# Patient Record
Sex: Female | Born: 1975 | Race: Black or African American | Hispanic: No | Smoking: Never smoker
Health system: Southern US, Community
[De-identification: ages and names within clinical notes are randomized; demographics above are authoritative.]

## PROBLEM LIST (undated history)

## (undated) DIAGNOSIS — F419 Anxiety disorder, unspecified: Secondary | ICD-10-CM

## (undated) DIAGNOSIS — D649 Anemia, unspecified: Secondary | ICD-10-CM

## (undated) DIAGNOSIS — F329 Major depressive disorder, single episode, unspecified: Secondary | ICD-10-CM

## (undated) DIAGNOSIS — J45909 Unspecified asthma, uncomplicated: Secondary | ICD-10-CM

## (undated) DIAGNOSIS — A630 Anogenital (venereal) warts: Secondary | ICD-10-CM

## (undated) HISTORY — PX: KNEE SURGERY: SHX244

## (undated) HISTORY — DX: Anxiety disorder, unspecified: F41.9

## (undated) HISTORY — DX: Major depressive disorder, single episode, unspecified: F32.9

---

## 1997-09-03 ENCOUNTER — Encounter: Admission: RE | Admit: 1997-09-03 | Discharge: 1997-12-02 | Payer: Self-pay | Admitting: Orthopedic Surgery

## 2012-06-05 DIAGNOSIS — F32A Depression, unspecified: Secondary | ICD-10-CM

## 2012-06-05 DIAGNOSIS — F419 Anxiety disorder, unspecified: Secondary | ICD-10-CM

## 2012-06-05 HISTORY — DX: Depression, unspecified: F32.A

## 2012-06-05 HISTORY — DX: Anxiety disorder, unspecified: F41.9

## 2014-01-30 ENCOUNTER — Encounter: Payer: Self-pay | Admitting: Internal Medicine

## 2014-01-30 ENCOUNTER — Ambulatory Visit: Payer: Self-pay | Attending: Internal Medicine | Admitting: Internal Medicine

## 2014-01-30 VITALS — BP 114/85 | HR 81 | Temp 99.4°F | Resp 16 | Ht 67.0 in | Wt 182.0 lb

## 2014-01-30 DIAGNOSIS — E049 Nontoxic goiter, unspecified: Secondary | ICD-10-CM | POA: Insufficient documentation

## 2014-01-30 DIAGNOSIS — J45909 Unspecified asthma, uncomplicated: Secondary | ICD-10-CM | POA: Insufficient documentation

## 2014-01-30 DIAGNOSIS — G8929 Other chronic pain: Secondary | ICD-10-CM | POA: Insufficient documentation

## 2014-01-30 DIAGNOSIS — M25569 Pain in unspecified knee: Secondary | ICD-10-CM | POA: Insufficient documentation

## 2014-01-30 DIAGNOSIS — R609 Edema, unspecified: Secondary | ICD-10-CM | POA: Insufficient documentation

## 2014-01-30 DIAGNOSIS — F411 Generalized anxiety disorder: Secondary | ICD-10-CM | POA: Insufficient documentation

## 2014-01-30 DIAGNOSIS — F329 Major depressive disorder, single episode, unspecified: Secondary | ICD-10-CM | POA: Insufficient documentation

## 2014-01-30 DIAGNOSIS — M25562 Pain in left knee: Secondary | ICD-10-CM

## 2014-01-30 DIAGNOSIS — F3289 Other specified depressive episodes: Secondary | ICD-10-CM | POA: Insufficient documentation

## 2014-01-30 LAB — COMPLETE METABOLIC PANEL WITH GFR
ALT: 13 U/L (ref 0–35)
AST: 15 U/L (ref 0–37)
Albumin: 4.5 g/dL (ref 3.5–5.2)
Alkaline Phosphatase: 54 U/L (ref 39–117)
BILIRUBIN TOTAL: 0.5 mg/dL (ref 0.2–1.2)
BUN: 8 mg/dL (ref 6–23)
CALCIUM: 9.6 mg/dL (ref 8.4–10.5)
CHLORIDE: 104 meq/L (ref 96–112)
CO2: 25 meq/L (ref 19–32)
Creat: 0.7 mg/dL (ref 0.50–1.10)
Glucose, Bld: 93 mg/dL (ref 70–99)
Potassium: 4.2 mEq/L (ref 3.5–5.3)
SODIUM: 138 meq/L (ref 135–145)
TOTAL PROTEIN: 7.8 g/dL (ref 6.0–8.3)

## 2014-01-30 LAB — CBC
HCT: 39.6 % (ref 36.0–46.0)
HEMOGLOBIN: 13.2 g/dL (ref 12.0–15.0)
MCH: 30.4 pg (ref 26.0–34.0)
MCHC: 33.3 g/dL (ref 30.0–36.0)
MCV: 91.2 fL (ref 78.0–100.0)
Platelets: 288 10*3/uL (ref 150–400)
RBC: 4.34 MIL/uL (ref 3.87–5.11)
RDW: 13.3 % (ref 11.5–15.5)
WBC: 6.2 10*3/uL (ref 4.0–10.5)

## 2014-01-30 LAB — LIPID PANEL
Cholesterol: 173 mg/dL (ref 0–200)
HDL: 61 mg/dL (ref >39)
LDL Cholesterol: 104 mg/dL — ABNORMAL HIGH (ref 0–99)
Total CHOL/HDL Ratio: 2.8 Ratio
Triglycerides: 42 mg/dL (ref <150)
VLDL: 8 mg/dL (ref 0–40)

## 2014-01-30 LAB — RHEUMATOID FACTOR: Rhuematoid fact SerPl-aCnc: <10 IU/mL (ref <=14)

## 2014-01-30 LAB — TSH: TSH: 1.054 u[IU]/mL (ref 0.350–4.500)

## 2014-01-30 LAB — T4, FREE: Free T4: 1.11 ng/dL (ref 0.80–1.80)

## 2014-01-30 MED ORDER — ACETAMINOPHEN-CODEINE #3 300-30 MG PO TABS: 1.0000 | ORAL_TABLET | Freq: Three times a day (TID) | ORAL | Status: DC | PRN

## 2014-01-30 NOTE — Progress Notes (Signed)
Pt is here to establish care. Pt reports having chronic pain in her left knee. Pt states that concerned about the pain in her joints spreading because right now she is having pain in her 1 finger on her right hand and middle toe on her right foot.

## 2014-01-30 NOTE — Progress Notes (Signed)
Patient ID: Joanne Baker, female   DOB: [birth year], 38 y.o.   MRN: [id number]  [id number]:[id number]  [id number]  DOB - [birth year]  CC:  Chief Complaint  Patient presents with  . Establish Care       HPI: Joanne Baker is a 38 y.o. female here today to establish medical care.  Patient reports that she has a history of depression, anxiety, and arthritis.  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 Originally had a torn meniscus, then had injury after meniscal repair surgery. She was last told that she may need a knee replacement but she is too young now. In the past she reports that she has been on perocets in the past which did not help.  She states that she has a knee brace and has had difficulty going up stairs and severe pain with walking.  Her index finger on her right hand and her second toe on her right foot has been painful at the joint spaces.  She denies swelling, redness, and warmth of joints.  She appears non toxic.   No Known Allergies Past Medical History  Diagnosis Date  . Anxiety 2014  . Depression 2014   No current outpatient prescriptions on file prior to visit.   No current facility-administered medications on file prior to visit.   Family History  Problem Relation Age of Onset  . Hypothyroidism Mother   . Arthritis Mother   . Cancer Father   . Diabetes Father   . Heart disease Father   . Cancer Sister   . Hypothyroidism Sister    History   Social History  . Marital Status: Single    Spouse Name: N/A    Number of Children: N/A  . Years of Education: N/A   Occupational History  . Not on file.   Social History Main Topics  . Smoking status: Never Smoker   . Smokeless tobacco: Not on file  . Alcohol Use: 2.5 oz/week    5 drink(s) per week  . Drug Use: No  . Sexual Activity: Yes   Other Topics Concern  . Not on file   Social History Narrative  . No  narrative on file   Review of Systems  Constitutional: Negative.   HENT: Negative.   Respiratory: Positive for cough and shortness of breath.        History of asthma   Cardiovascular: Negative.   Gastrointestinal: Negative.   Genitourinary: Negative.   Musculoskeletal: Negative.   Skin: Negative.   Neurological: Negative.   Psychiatric/Behavioral: Negative.       Objective:   Filed Vitals:   01/30/14 1058  BP: 114/85  Pulse: 81  Temp: 99.4 F (37.4 C)  Resp: 16   Physical Exam  Constitutional: She is oriented to person, place, and time.  HENT:  Right Ear: External ear normal.  Left Ear: External ear normal.  Mouth/Throat: Oropharynx is clear and moist.  Eyes: Conjunctivae and EOM are normal. Pupils are equal, round, and reactive to light.  Neck: Normal range of motion.  Cardiovascular: Normal rate, regular rhythm, normal heart sounds and intact distal pulses.   Pulmonary/Chest: Effort normal and breath sounds normal.  Abdominal: Soft. Bowel sounds are normal.  Musculoskeletal: She exhibits edema (base of index finger) and tenderness.  Neurological: She is alert and oriented to person, place, and time. She has normal reflexes. No cranial nerve deficit.  Skin: Skin  is warm and dry.  Psychiatric: She has a normal mood and affect.     No results found for this basename: WBC, HGB, HCT, MCV, PLT   No results found for this basename: CREATININE, BUN, NA, K, CL, CO2    No results found for this basename: HGBA1C   Lipid Panel  No results found for this basename: chol, trig, hdl, cholhdl, vldl, ldlcalc       Assessment and plan:   Joanne Baker was seen today for establish care.  Diagnoses and associated orders for this visit:  Enlarged thyroid - TSH - T4, Free - CBC - COMPLETE METABOLIC PANEL WITH GFR - Lipid panel  Chronic knee pain, left - Rheumatoid factor - ANA - Ambulatory referral to Sports Medicine - acetaminophen-codeine (TYLENOL #3) 300-30 MG per  tablet; Take 1 tablet by mouth every 8 (eight) hours as needed for moderate pain.   Return if symptoms worsen or fail to improve.   The patient was given clear instructions to go to ER or return to medical center if symptoms don't improve, worsen or new problems develop. The patient verbalized understanding.   Holland Commons, NP-C [facility] [phone] 01/30/2014, 11:21 AM

## 2014-02-02 LAB — ANA: Anti Nuclear Antibody(ANA): NEGATIVE

## 2014-02-03 ENCOUNTER — Telehealth: Payer: Self-pay | Admitting: Emergency Medicine

## 2014-02-03 ENCOUNTER — Telehealth: Payer: Self-pay | Admitting: *Deleted

## 2014-02-03 NOTE — Telephone Encounter (Signed)
Left message for pt to call for lab results 

## 2014-02-03 NOTE — Telephone Encounter (Signed)
Pt is aware of her lab results.  

## 2014-02-03 NOTE — Telephone Encounter (Signed)
Message copied by Darlis Loan on Tue Feb 03, 2014 11:20 AM ------      Message from: Holland Commons A      Created: Mon Feb 02, 2014 12:40 PM       Labs are normal ------

## 2014-02-03 NOTE — Telephone Encounter (Signed)
Message copied by Raynelle Chary on Tue Feb 03, 2014 12:05 PM ------      Message from: Holland Commons A      Created: Mon Feb 02, 2014 12:40 PM       Labs are normal ------

## 2014-02-11 ENCOUNTER — Ambulatory Visit (INDEPENDENT_AMBULATORY_CARE_PROVIDER_SITE_OTHER): Payer: Self-pay | Admitting: Sports Medicine

## 2014-02-11 ENCOUNTER — Encounter: Payer: Self-pay | Admitting: Sports Medicine

## 2014-02-11 VITALS — BP 111/75 | Ht 66.5 in | Wt 175.0 lb

## 2014-02-11 DIAGNOSIS — M79644 Pain in right finger(s): Secondary | ICD-10-CM

## 2014-02-11 DIAGNOSIS — M357 Hypermobility syndrome: Secondary | ICD-10-CM

## 2014-02-11 DIAGNOSIS — M25569 Pain in unspecified knee: Secondary | ICD-10-CM

## 2014-02-11 DIAGNOSIS — M25562 Pain in left knee: Secondary | ICD-10-CM | POA: Insufficient documentation

## 2014-02-11 DIAGNOSIS — M79609 Pain in unspecified limb: Secondary | ICD-10-CM

## 2014-02-11 MED ORDER — METHYLPREDNISOLONE ACETATE 40 MG/ML IJ SUSP
40.0000 mg | Freq: Once | INTRAMUSCULAR | Status: AC
  Administered 2014-02-11: 40 mg via INTRA_ARTICULAR

## 2014-02-11 NOTE — Progress Notes (Signed)
Joanne Baker - 38 y.o. female MRN [id number]  Date of birth: [birth year]  SUBJECTIVE:  Including CC & ROS.  The patient is referred for evaluation of: Left Knee Pain: Prolonged symptoms from significant surgical interventions in the past. She has had 5 months of worsening intermittent mechanical locking with ambulation. She denies any falls, trauma or reinjury to this knee but notices she will be fine then all of a sudden have sharp shooting burning pain deep within the knee and inability to bend or flex. She will be started in this position for a couple of minutes until she is able to reposition and is able to move the knee Right Index Finger Pain: 5-6 months of right index finger pain that has been worse with activity. She reports mainly pain with any type of grip strength especially while shopping. She is left-hand dominant. No prior injury. No discoloration, no swelling.   HISTORY: Past Medical, Surgical, Social, and Family History Reviewed & Updated per EMR. Pertinent Historical Findings include: Non-smoker, some EtOH.  Hx of Anxiety and depression Reported 3 prior knee surgeries including a microfracture and meniscectomy; most recent surgeries 9 years ago.; prior C-section  DATA REVIEWED: Labs from 01/30/14: normal CMET, CBC. TSH 1.054. Normal ANA and RF  OBJECTIVE FINDINGS:  VS:  HT:5' 6.5" (168.9 cm)   WT:175 lb (79.379 kg)  BMI:27.9          BP:111/75 mmHg  HR: bpm  TEMP: ( )  RESP:   PHYSICAL EXAM:            GENERAL:  Adult African American female. In no discomfort; no respiratory distress                PSYCH:  alert and appropriate, good insight  Left Knee Exam:   APPEAR/PALP:  No significant effusion. Overall well aligned. Poor VMO definition. Positive patellar grind. No medial or lateral jointline tenderness.                    ROM:  Full flexion and extension        STRENGTH:  Extensor mechanism 4/5, flexion 5 out of 5.                   NV:  Sensation grossly  intact.              Tests:  Stable anterior posterior drawer. Stable to varus and valgus stress. Negative Thessaly Right Hand Exam:   APPEAR/PALP:  Normal-appearing, long fingers. Hypermobile bilateral MCP joints 2 dorsal strain. Right radial collateral ligament at the MCP of the index finger without solid endpoint. Good on the left.                    ROM:  Full finger and wrist range of motion.        STRENGTH:  Finger strength 5+ out of 5                   NV:  Sensation grossly intact, good capillary refill [place] Hypermobility SCORE:  7 / 9 5th Finger >90o L - Yes.   R - Yes.    Thumb to forearm L - No. R - No.  Elbow hyperextension > -10 L - Yes.   R - Yes.    Knee hyperextension > -10 L - Yes.   R - Yes.    Hand to ground Yes.         ASSESSMENT:  1. Left knee pain   2. Finger pain, right   3. 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 - Left knee pain is likely secondary to significant osteoarthritis and likely loose body within the knee given the prior microfracture and chondral damage. Suspect OCD. Patient has x-rays from April of this year and we'll obtain copies and bring them to her next visit. She is not a surgical candidate at this time due to lack of insurance. - Right radial collateral ligament of the index finger likely strain/sprain. Hypermobile fingers in general.   PLAN: See problem based charting & AVS for additional documentation. - left knee injection today.  Patient will bring in x-rays obtained from [facility] to her next visit to review. Discussed that she needs to bring in the actual images and not just report.  - QUAD sets for VMO strengthening. - hand exercises for strengthening/stablizing joint.  Buddy taping with activity of second and third fingers. > Return in 6 weeks (on 03/25/2014) for re-evaluation of finger and knee.    PROCEDURE NOTE : Left knee  Injection The risks, benefits and expected outcomes of the injection were reviewed and she wishes to undergo the above named procedure.  Written consent was obtained. After an appropriate time out was taken the left main was prepped in a sterile fashion and injected as below: Approach:  Bent knee inferior lateral Needle:  25-gauge 1.5  inch Meds:   1 cc of 40 mg Depo-Medrol, 3 cc of 1% lidocaine without epi A bandaid was applied to the area. This procedure was well tolerated and there were no complications.

## 2014-02-11 NOTE — Patient Instructions (Signed)
Remember to do the exercises we discussed for your knee. Work up to doing 15 reps three times a day.  Use a squeeze ball for your finger to strengthen the surrounding muscles.

## 2014-03-02 ENCOUNTER — Ambulatory Visit: Payer: Self-pay

## 2014-03-18 ENCOUNTER — Ambulatory Visit: Payer: Self-pay | Admitting: Sports Medicine

## 2014-05-07 ENCOUNTER — Encounter (HOSPITAL_COMMUNITY): Payer: Self-pay | Admitting: *Deleted

## 2014-05-07 ENCOUNTER — Emergency Department (HOSPITAL_COMMUNITY)
Admission: EM | Admit: 2014-05-07 | Discharge: 2014-05-07 | Disposition: A | Discharge status: Home / Self Care | Payer: Self-pay | Attending: Emergency Medicine | Admitting: Emergency Medicine

## 2014-05-07 DIAGNOSIS — Z7951 Long term (current) use of inhaled steroids: Secondary | ICD-10-CM | POA: Insufficient documentation

## 2014-05-07 DIAGNOSIS — F1010 Alcohol abuse, uncomplicated: Secondary | ICD-10-CM | POA: Insufficient documentation

## 2014-05-07 DIAGNOSIS — Z3202 Encounter for pregnancy test, result negative: Secondary | ICD-10-CM | POA: Insufficient documentation

## 2014-05-07 DIAGNOSIS — F32A Depression, unspecified: Secondary | ICD-10-CM

## 2014-05-07 DIAGNOSIS — F419 Anxiety disorder, unspecified: Secondary | ICD-10-CM | POA: Insufficient documentation

## 2014-05-07 DIAGNOSIS — F329 Major depressive disorder, single episode, unspecified: Secondary | ICD-10-CM | POA: Insufficient documentation

## 2014-05-07 LAB — URINALYSIS, ROUTINE W REFLEX MICROSCOPIC
Glucose, UA: NEGATIVE mg/dL
Hgb urine dipstick: NEGATIVE
KETONES UR: 15 mg/dL — AB
Nitrite: NEGATIVE
PH: 6 (ref 5.0–8.0)
PROTEIN: 30 mg/dL — AB
Specific Gravity, Urine: 1.03 (ref 1.005–1.030)
Urobilinogen, UA: 1 mg/dL (ref 0.0–1.0)

## 2014-05-07 LAB — COMPREHENSIVE METABOLIC PANEL
ALBUMIN: 3.7 g/dL (ref 3.5–5.2)
ALK PHOS: 52 U/L (ref 39–117)
ALT: 16 U/L (ref 0–35)
ANION GAP: 14 (ref 5–15)
AST: 17 U/L (ref 0–37)
BILIRUBIN TOTAL: 0.5 mg/dL (ref 0.3–1.2)
BUN: 4 mg/dL — ABNORMAL LOW (ref 6–23)
CHLORIDE: 100 meq/L (ref 96–112)
CO2: 23 mEq/L (ref 19–32)
Calcium: 9.3 mg/dL (ref 8.4–10.5)
Creatinine, Ser: 0.68 mg/dL (ref 0.50–1.10)
GFR calc Af Amer: >90 mL/min (ref >90)
GFR calc non Af Amer: >90 mL/min (ref >90)
Glucose, Bld: 96 mg/dL (ref 70–99)
POTASSIUM: 3.5 meq/L — AB (ref 3.7–5.3)
Sodium: 137 mEq/L (ref 137–147)
TOTAL PROTEIN: 7.5 g/dL (ref 6.0–8.3)

## 2014-05-07 LAB — CBC
HCT: 37.2 % (ref 36.0–46.0)
Hemoglobin: 12.2 g/dL (ref 12.0–15.0)
MCH: 29.8 pg (ref 26.0–34.0)
MCHC: 32.8 g/dL (ref 30.0–36.0)
MCV: 90.7 fL (ref 78.0–100.0)
PLATELETS: 212 10*3/uL (ref 150–400)
RBC: 4.1 MIL/uL (ref 3.87–5.11)
RDW: 11.8 % (ref 11.5–15.5)
WBC: 7.7 10*3/uL (ref 4.0–10.5)

## 2014-05-07 LAB — URINE MICROSCOPIC-ADD ON

## 2014-05-07 LAB — RAPID URINE DRUG SCREEN, HOSP PERFORMED
AMPHETAMINES: NOT DETECTED
BENZODIAZEPINES: NOT DETECTED
Barbiturates: NOT DETECTED
COCAINE: NOT DETECTED
Opiates: NOT DETECTED
Tetrahydrocannabinol: NOT DETECTED

## 2014-05-07 LAB — PREGNANCY, URINE: PREG TEST UR: NEGATIVE

## 2014-05-07 LAB — ETHANOL: Alcohol, Ethyl (B): <11 mg/dL (ref 0–11)

## 2014-05-07 NOTE — ED Notes (Signed)
Writer [place] call from [place] who will fax over papers regarding pt treatment and she will speak with provider

## 2014-05-07 NOTE — ED Notes (Addendum)
Pt denies SI/HI upon leaving the ED. Pt given outpatient resources.

## 2014-05-07 NOTE — BHH Counselor (Signed)
Writer called and spoke w/ Will Dansie PA-C prior to TA. [facility] telassessment Cart #2 isn't working. Marcelino DusterMichelle RN is going to try using Peds Cart#1.  Evette Cristalaroline Paige Raman [place], [place] Assessment Counselor

## 2014-05-07 NOTE — ED Notes (Addendum)
Patient states anxiety and depression has been worsening x 30 days, last 3 days patient states anxiety out of control, drinking more than normal, medications are not working, patient states she has no thoughts of hurting self or others, she would just take an extra pill with alcohol to try to get some sleep

## 2014-05-07 NOTE — ED Notes (Signed)
Pt states "i haven't had a period since i don't know when" hx irregular periods

## 2014-05-07 NOTE — Discharge Instructions (Signed)
Alcohol and Nutrition Nutrition serves two purposes. It provides energy. It also maintains body structure and function. Food supplies energy. It also provides the building blocks needed to replace worn or damaged cells. Alcoholics often eat poorly. This limits their supply of essential nutrients. This affects energy supply and structure maintenance. Alcohol also affects the body's nutrients in: 1. Digestion. 2. Storage. 3. Using and getting rid of waste products. IMPAIRMENT OF NUTRIENT DIGESTION AND UTILIZATION   Once ingested, food must be broken down into small components (digested). Then it is available for energy. It helps maintain body structure and function. Digestion begins in the mouth. It continues in the stomach and intestines, with help from the pancreas. The nutrients from digested food are absorbed from the intestines into the blood. Then they are carried to the liver. The liver prepares nutrients for:  Immediate use.  Storage and future use.  Alcohol inhibits the breakdown of nutrients into usable molecules.  It decreases secretion of digestive enzymes from the pancreas.  Alcohol impairs nutrient absorption by damaging the cells lining the stomach and intestines.  It also interferes with moving some nutrients into the blood.  In addition, nutritional deficiencies themselves may lead to further absorption problems.  For example, folate deficiency changes the cells that line the small intestine. This impairs how water is absorbed. It also affects absorbed nutrients. These include glucose, sodium, and additional folate.  Even if nutrients are digested and absorbed, alcohol can prevent them from being fully used. It changes their transport, storage, and excretion. Impaired utilization of nutrients by alcoholics is indicated by:  Decreased liver stores of vitamins, such as vitamin A.  Increased excretion of nutrients such as fat. ALCOHOL AND ENERGY SUPPLY   Three basic  nutritional components found in food are:  Carbohydrates.  Proteins.  Fats.  These are used as energy. Some alcoholics take in as much as 50% of their total daily calories from alcohol. They often neglect important foods.  Even when enough food is eaten, alcohol can impair the ways the body controls blood sugar (glucose) levels. It may either increase or decrease blood sugar.  In non-diabetic alcoholics, increased blood sugar (hyperglycemia) is caused by poor insulin secretion. It is usually temporary.  Decreased blood sugar (hypoglycemia) can cause serious injury even if this condition is short-lived. Low blood sugar can happen when a fasting or malnourished person drinks alcohol. When there is no food to supply energy, stored sugar is used up. The products of alcohol inhibit forming glucose from other compounds such as amino acids. As a result, alcohol causes the brain and other body tissue to lack glucose. It is needed for energy and function.  Alcohol is an energy source. But how the body processes and uses the energy from alcohol is complex. Also, when alcohol is substituted for carbohydrates, subjects tend to lose weight. This indicates that they get less energy from alcohol than from food. ALCOHOL - MAINTAINING CELL STRUCTURE AND FUNCTION  Structure Cells are made mostly of protein. So an adequate protein diet is important for maintaining cell structure. This is especially true if cells are being damaged. Research indicates that alcohol affects protein nutrition by causing impaired:  Digestion of proteins to amino acids.  Processing of amino acids by the small intestine and liver.  Synthesis of proteins from amino acids.  Protein secretion by the liver. Function Nutrients are essential for the body to function well. They provide the tools that the body needs to work well:  Proteins. °· Vitamins. °· Minerals. °Alcohol can disrupt body function. It may cause nutrient  deficiencies. And it may interfere with the way nutrients are processed. °Vitamins °· Vitamins are essential to maintain growth and normal metabolism. They regulate many of the body`s processes. Chronic heavy drinking causes deficiencies in many vitamins. This is caused by eating less. And, in some cases, vitamins may be poorly absorbed. For example, alcohol inhibits fat absorption. It impairs how the vitamins A, E, and D are normally absorbed along with dietary fats. Not enough vitamin A may cause night blindness. Not enough vitamin D may cause softening of the bones. °· Some alcoholics lack vitamins A, C, D, E, K, and the B vitamins. These are all involved in wound healing and cell maintenance. In particular, because vitamin K is necessary for blood clotting, lacking that vitamin can cause delayed clotting. The result is excess bleeding. Lacking other vitamins involved in brain function may cause severe neurological damage. °Minerals °Deficiencies of minerals such as calcium, magnesium, iron, and zinc are common in alcoholics. The alcohol itself does not seem to affect how these minerals are absorbed. Rather, they seem to occur secondary to other alcohol-related problems, such as: °· Less calcium absorbed. °· Not enough magnesium. °· More urinary excretion. °· Vomiting. °· Diarrhea. °· Not enough iron due to gastrointestinal bleeding. °· Not enough zinc or losses related to other nutrient deficiencies. °· Mineral deficiencies can cause a variety of medical consequences. These range from calcium-related bone disease to zinc-related night blindness and skin lesions. °ALCOHOL, MALNUTRITION, AND MEDICAL COMPLICATIONS  °Liver Disease  °· Alcoholic liver damage is caused primarily by alcohol itself. But poor nutrition may increase the risk of alcohol-related liver damage. For example, nutrients normally found in the liver are known to be affected by drinking alcohol. These include carotenoids, which are the major  sources of vitamin A, and vitamin E compounds. Decreases in such nutrients may play some role in alcohol-related liver damage. °Pancreatitis °· Research suggests that malnutrition may increase the risk of developing alcoholic pancreatitis. Research suggests that a diet lacking in protein may increase alcohol's damaging effect on the pancreas. °Brain °· Nutritional deficiencies may have severe effects on brain function. These may be permanent. Specifically, thiamine deficiencies are often seen in alcoholics. They can cause severe neurological problems. These include: °¨ Impaired movement. °¨ Memory loss seen in Wernicke-Korsakoff syndrome. °Pregnancy °· Alcohol has toxic effects on fetal development. It causes alcohol-related birth defects. They include fetal alcohol syndrome. Alcohol itself is toxic to the fetus. Also, the nutritional deficiency can affect how the fetus develops. That may compound the risk of developmental damage. °· Nutritional needs during pregnancy are 10% to 30% greater than normal. Food intake can increase by as much as 140% to cover the needs of both mother and fetus. An alcoholic mother`s nutritional problems may adversely affect the nutrition of the fetus. And alcohol itself can also restrict nutrition flow to the fetus. °NUTRITIONAL STATUS OF ALCOHOLICS  °Techniques for assessing nutritional status include: °· Taking body measurements to estimate fat reserves. They include: °¨ Weight. °¨ Height. °¨ Mass. °¨ Skin fold thickness. °· Performing blood analysis to provide measurements of circulating: °¨ Proteins. °¨ Vitamins. °¨ Minerals. °· These techniques tend to be imprecise. For many nutrients, there is no clear "cut-off" point that would allow an accurate definition of deficiency. So assessing the nutritional status of alcoholics is limited by these techniques. Dietary status may provide information about the risk of developing nutritional problems.   Dietary status is assessed by:  Taking  patients' dietary histories.  Evaluating the amount and types of food they are eating.  It is difficult to determine what exact amount of alcohol begins to have damaging effects on nutrition. In general, moderate drinkers have 2 drinks or less per day. They seem to be at little risk for nutritional problems. Various medical disorders begin to appear at greater levels.  Research indicates that the majority of even the heaviest drinkers have few obvious nutritional deficiencies. Many alcoholics who are hospitalized for medical complications of their disease do have severe malnutrition. Alcoholics tend to eat poorly. Often they eat less than the amounts of food necessary to provide enough:  Carbohydrates.  Protein.  Fat.  Vitamins A and C.  B vitamins.  Minerals like calcium and iron. Of major concern is alcohol's effect on digesting food and use of nutrients. It may shift a mildly malnourished person toward severe malnutrition. Document Released: 03/16/2005 Document Revised: 08/14/2011 Document Reviewed: 08/30/2005 [facility] Patient Information 2015 [place], [place]. This information is not intended to replace advice given to you by your health care provider. Make sure you discuss any questions you have with your health care provider.  Alcohol Withdrawal Anytime drug use is interfering with normal living activities it has become abuse. This includes problems with family and friends. Psychological dependence has developed when your mind tells you that the drug is needed. This is usually followed by physical dependence when a continuing increase of drugs are required to get the same feeling or "high." This is known as addiction or chemical dependency. A person's risk is much higher if there is a history of chemical dependency in the family. Mild Withdrawal Following Stopping Alcohol, When Addiction or Chemical Dependency Has Developed When a person has developed tolerance to alcohol, any sudden  stopping of alcohol can cause uncomfortable physical symptoms. Most of the time these are mild and consist of tremors in the hands and increases in heart rate, breathing, and temperature. Sometimes these symptoms are associated with anxiety, panic attacks, and bad dreams. There may also be stomach upset. Normal sleep patterns are often interrupted with periods of inability to sleep (insomnia). This may last for 6 months. Because of this discomfort, many people choose to continue drinking to get rid of this discomfort and to try to feel normal. Severe Withdrawal with Decreased or No Alcohol Intake, When Addiction or Chemical Dependency Has Developed About five percent of alcoholics will develop signs of severe withdrawal when they stop using alcohol. One sign of this is development of generalized seizures (convulsions). Other signs of this are severe agitation and confusion. This may be associated with believing in things which are not real or seeing things which are not really there (delusions and hallucinations). Vitamin deficiencies are usually present if alcohol intake has been long-term. Treatment for this most often requires hospitalization and close observation. Addiction can only be helped by stopping use of all chemicals. This is hard but may save your life. With continual alcohol use, possible outcomes are usually loss of self respect and esteem, violence, and death. Addiction cannot be cured but it can be stopped. This often requires outside help and the care of professionals. Treatment centers are listed in the yellow pages under Cocaine, Narcotics, and Alcoholics Anonymous. Most hospitals and clinics can refer you to a specialized care center. It is not necessary for you to go through the uncomfortable symptoms of withdrawal. Your caregiver can provide you with medicines that will  help you through this difficult period. Try to avoid situations, friends, or drugs that made it possible for you to keep  using alcohol in the past. Learn how to say no. It takes a long period of time to overcome addictions to all drugs, including alcohol. There may be many times when you feel as though you want a drink. After getting rid of the physical addiction and withdrawal, you will have a lessening of the craving which tells you that you need alcohol to feel normal. Call your caregiver if more support is needed. Learn who to talk to in your family and among your friends so that during these periods you can receive outside help. Alcoholics Anonymous (AA) has helped many people over the years. To get further help, contact AA or call your caregiver, counselor, or clergyperson. Al-Anon and Alateen are support groups for friends and family members of an alcoholic. The people who love and care for an alcoholic often need help, too. For information about these organizations, check your phone directory or call a local alcoholism treatment center.  SEEK IMMEDIATE MEDICAL CARE IF:  4. You have a seizure. 5. You have a fever. 6. You experience uncontrolled vomiting or you vomit up blood. This may be bright red or look like black coffee grounds. 7. You have blood in the stool. This may be bright red or appear as a black, tarry, bad-smelling stool. 8. You become lightheaded or faint. Do not drive if you feel this way. Have someone else drive you or call 161 for help. 9. You become more agitated or confused. 10. You develop uncontrolled anxiety. 11. You begin to see things that are not really there (hallucinate). Your caregiver has determined that you completely understand your medical condition, and that your mental state is back to normal. 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 Document Released: 03/01/2005 Document  Revised: 08/14/2011 Document Reviewed: 01/08/2008 [facility] Patient Information 2015 [place], [place]. This information is not intended to replace advice given to you by your health care provider. Make sure you discuss any questions you have with your health care provider. Depression Depression refers to feeling sad, low, down in the dumps, blue, gloomy, or empty. In general, there are two kinds of depression: 12. Normal sadness or normal grief. This kind of depression is one that we all feel from time to time after upsetting life experiences, such as the loss of a job or the ending of a relationship. This kind of depression is considered normal, is short lived, and resolves within a few days to 2 weeks. Depression experienced after the loss of a loved one (bereavement) often lasts longer than 2 weeks but normally gets better with time. 13. Clinical depression. This kind of depression lasts longer than normal sadness or normal grief or interferes with your ability to function at home, at work, and in school. It also interferes with your personal relationships. It affects almost every aspect of your life. Clinical depression is an illness. Symptoms of depression can also be caused by conditions other than those mentioned above, such as:  Physical illness. Some physical illnesses, including underactive thyroid gland (hypothyroidism), severe anemia, specific types of cancer, diabetes, uncontrolled seizures, heart and lung problems, strokes, and chronic pain are commonly associated with symptoms  of depression.  Side effects of some prescription medicine. In some people, certain types of medicine can cause symptoms of depression.  Substance abuse. Abuse of alcohol and illicit drugs can cause symptoms of depression. SYMPTOMS Symptoms of normal sadness and normal grief include the following:  Feeling sad or crying for short periods of time.  Not caring about anything (apathy).  Difficulty sleeping or  sleeping too much.  No longer able to enjoy the things you used to enjoy.  Desire to be by oneself all the time (social isolation).  Lack of energy or motivation.  Difficulty concentrating or remembering.  Change in appetite or weight.  Restlessness or agitation. Symptoms of clinical depression include the same symptoms of normal sadness or normal grief and also the following symptoms:  Feeling sad or crying all the time.  Feelings of guilt or worthlessness.  Feelings of hopelessness or helplessness.  Thoughts of suicide or the desire to harm yourself (suicidal ideation).  Loss of touch with reality (psychotic symptoms). Seeing or hearing things that are not real (hallucinations) or having false beliefs about your life or the people around you (delusions and paranoia). DIAGNOSIS  The diagnosis of clinical depression is usually based on how bad the symptoms are and how long they have lasted. Your health care provider will also ask you questions about your medical history and substance use to find out if physical illness, use of prescription medicine, or substance abuse is causing your depression. Your health care provider may also order blood tests. TREATMENT  Often, normal sadness and normal grief do not require treatment. However, sometimes antidepressant medicine is given for bereavement to ease the depressive symptoms until they resolve. The treatment for clinical depression depends on how bad the symptoms are but often includes antidepressant medicine, counseling with a mental health professional, or both. Your health care provider will help to determine what treatment is best for you. Depression caused by physical illness usually goes away with appropriate medical treatment of the illness. If prescription medicine is causing depression, talk with your health care provider about stopping the medicine, decreasing the dose, or changing to another medicine. Depression caused by the  abuse of alcohol or illicit drugs goes away when you stop using these substances. Some adults need professional help in order to stop drinking or using drugs. SEEK IMMEDIATE MEDICAL CARE IF:  You have thoughts about hurting yourself or others.  You lose touch with reality (have psychotic symptoms).  You are taking medicine for depression and have a serious side effect. FOR MORE INFORMATION  National Alliance on Mental Illness: www.nami.[employer] of Mental Health: http://www.maynard.net/ Document Released: 05/19/2000 Document Revised: 10/06/2013 Document Reviewed: 08/21/2011 [facility] Patient Information 2015 [place], [place]. This information is not intended to replace advice given to you by your health care provider. Make sure you discuss any questions you have with your health care provider.   Emergency Department Resource Guide 1) Find a Doctor and Pay Out of Pocket Although you won't have to find out who is covered by your insurance plan, it is a good idea to ask around and get recommendations. You will then need to call the office and see if the doctor you have chosen will accept you as a new patient and what types of options they offer for patients who are self-pay. Some doctors offer discounts or will set up payment plans for their patients who do not have insurance, but you will need to ask so you aren't surprised when  you get to your appointment.  2) Contact Your Local Health Department Not all health departments have doctors that can see patients for sick visits, but many do, so it is worth a call to see if yours does. If you don't know where your local health department is, you can check in your phone book. The CDC also has a tool to help you locate your state's health department, and many state websites also have listings of all of their local health departments.  3) Find a Walk-in Clinic If your illness is not likely to be very severe or complicated, you may want to try a  walk in clinic. These are popping up all over the country in pharmacies, drugstores, and shopping centers. They're usually staffed by nurse practitioners or physician assistants that have been trained to treat common illnesses and complaints. They're usually fairly quick and inexpensive. However, if you have serious medical issues or chronic medical problems, these are probably not your best option.  No Primary Care Doctor: - Call [facility] at  [phone] - they can help you locate a primary care doctor that  accepts your insurance, provides certain services, etc. - Physician Referral Service- [phone]  Chronic Pain Problems: Organization         Address  Phone   Notes  [facility]  [phone] Patients need to be referred by their primary care doctor.   Medication Assistance: Organization         Address  Phone   Notes  [facility] Medication [facility] [address] [place] [place], [place] [address] [phone] --Must be a resident of [facility] -- Must have NO insurance coverage whatsoever (no Medicaid/ Medicare, etc.) -- The pt. MUST have a primary care doctor that directs their care regularly and follows them in the community   [facility]  [phone]   [employer]  [phone]    Agencies that provide inexpensive medical care: Organization         Address  Phone   Notes  [facility]  [phone]   [facility]    [phone]   [facility] [address] [place], [place] [address] [phone]   [facility] [address] [place] [phone]   [facility]anned [facility]    [phone]   [facility]    [phone]   [facility]  [address], [place] Phone:  [phone], Fax:  [phone] Hours of Operation:  9 am - 6 pm, M-F.  Also accepts Medicaid/Medicare and self-pay.  [facility]  [address], [place] Phone: [phone], Fax: [phone]. Hours of Operation:  8:30 am - 5:30 pm, M-F.  Also accepts Medicaid and self-pay.  [facility] [place] [address] [place] Phone: [phone]   [facility] [address] Natasha Bence [place], [place] [phone], Ext. [phone] Mondays & Thursdays: 7-9 AM.  First 15 patients are seen on a first come, first serve basis.    Medicaid-accepting [facility] Providers:  Organization         Address  Phone   Notes  [facility] [address],  [phone] Also accepts self-pay patients.  [facility] [address] Laurell Josephs [place], [place]  [phone]   [facility] [address], [place] [phone]   [facility] [address] [place] [phone]   Renaye Rakers [address], [place]   [phone] Only accepts [place] patients after they have their name applied to their card.   Self-Pay (no insurance) in [facility]:  Organization         Address  Phone   Notes  Sickle Cell Patients, [facility] [address] [place], [place] [phone]   [facility] [address] [place], [place] [phone]   [facility] [place]  [address], [place] [phone]   [facility]/Dr. Osei-Bonsu  [address] [place] or [address], [place] [phone] Phone number for both [place] and [place] locations is the same.  [facility] [address], [place] [phone]   [facility] [address], [place] or [address] [phone]   [facility] [address] [place] [phone], phone; [phone], fax Sees  patients 1st and 3rd Saturday of every month.  Must not qualify for public or private insurance (i.e. Medicaid, Medicare, [place] Health Choice, Veterans' Benefits)  Household income should be no more than 200% of the poverty level The clinic cannot treat you if you are pregnant or think you are pregnant  Sexually transmitted diseases are not treated at the clinic.    Dental Care: Organization         Address  Phone  Notes  [facility] Department of [facility] [address] [place], [place] [phone] Accepts children up to age 58 who are enrolled in [place] or [place] Health Choice; pregnant women with a Medicaid card; and children who have applied for Medicaid or [place] Health Choice, but were declined, whose parents can pay a reduced fee at time of service.  [facility] Department of [facility]  [address], [place] [phone] Accepts children up to age 58 who are enrolled in [place] or [place] Health Choice; pregnant women with a Medicaid card; and children who have applied for Medicaid or [place] Health Choice, but were declined, whose parents can pay a reduced fee at time of service.  [facility]  [address] [place], [place] [phone] Patients are seen by appointment only. Walk-ins are not accepted. [facility] will see patients 68 years of age and older. Monday - Tuesday (8am-5pm) Most Wednesdays (8:30-5pm) $30 per visit, cash only  [facility]  [address], [facility] [phone] Patients are seen by appointment only. Walk-ins are not accepted. [facility] will see patients 38 years of age and older. One Wednesday Evening (Monthly: Volunteer Based).  $30 per visit, cash only  [employer]  [phone] for adults; Children under age 53, call Graduate Pediatric Dentistry at [phone]. Children aged 49-14, please call [phone] to request a  pediatric application.  Dental services are provided in all areas of dental care including fillings, crowns and bridges, complete and partial dentures, implants, gum treatment, root canals, and extractions. Preventive care is also provided. Treatment is provided to both adults and children. Patients are selected via a lottery and there is often a waiting list.   [phone]  [facility] [address], [place]  [phone] [email or web address]   [facility] [address], [place], [place] [phone], Ext. [phone] Second and Fourth Thursday of each month, opens at 6:30 AM; Clinic ends at 9 AM.  Patients are seen on a first-come first-served basis, and a limited number are seen during each clinic.   [facility]  [address], [place], [place] [phone]   Eligibility Requirements You must have lived in [place], [place], or [place] counties for at least the last three months.   You cannot be eligible for state or federal sponsored National Cityhealthcare insurance, including [employer], [place], or [employer]icare.   You generally cannot be eligible for healthcare insurance through your employer.    How to apply: Eligibility screenings are held every Tuesday and Wednesday afternoon from 1:00 pm until 4:00 pm. You do not need an appointment for the interview!  [facility] [address], [place], [place] [phone]   [facility]  [phone]   [facility]  [phone]   [facility]  [phone]    Behavioral Health Resources in the Community: Intensive Outpatient Programs Organization         Address  Phone  Notes  [facility] [address], [place], [place] [phone]   [facility] [address], [place], [place] [phone]   [facility] [address], [place], [place]  [phone]   [facility] [address],  [place], [place] [phone] or [phone]   Substance Abuse Resources Organization         Address  Phone  Notes  [facility]  [phone]   [facility]  [phone]   [facility]  [phone]   [facility]  [phone]   Residential & Outpatient Substance Abuse Program  [phone]   Psychological Services Organization         Address  Phone  Notes  [facility]  [phone]   [facility]  [phone]- [phone]   [facility] [address], [place] [phone] or [phone]    Mobile Crisis Teams Organization         Address  Phone  Notes  [facility], Mobile Crisis Care Unit  [phone]   [facility]  [address] [place], [place] [phone]   Doristine LocksSharon DeEsch [address] [place] [phone]    Self-Help/Support Groups Organization         Address  Phone             Notes  [facility] [place] - variety of support groups  [phone] Call for more information  Narcotics Anonymous (NA), [facility] [address], [place]  2 meetings at this location   Statisticianesidential Treatment Programs Organization         Address  Phone  Notes  [facility] [address],    [place]  [phone]   [facility]  [address], [place] [phone], [place], [place] [phone]   [facility] [address], [place] [phone] Admissions: 8am-3pm M-F  [facility] [address]    [place], [place] [phone]   [facility] [address], [place], [place] [phone]   [facility] [address]  [place], [place] [phone]   [facility] - Intensive Outpatient [address], [place], [place] [phone]   [facility] ([facility] [address] [place]    [place], [place] [phone] or [phone]   [facility] ([facility]) [address], [place], [place] [phone] Accepts Medicaid  [facility] [address],  [place] [phone] Substance Abuse/Addiction Treatment   [facility]         Address  Phone  Notes  [facility]  [phone]   Angie Fava, PhD [address] [place], [place]   [phone] or [phone]   [facility]   [address] [place], [place] [phone]   [facility] [address] [place], [place] [phone] Insurance/Medicaid/sponsorship through [facility] [address]                                    [place], [place] [phone] Therapy/tele-psych/case  [facility] [address] [place], [place] [phone]    Dr. Lolly Mustache  [phone]   [facility]  [employer] [facility] Dept. 1) [address] [place] 2) [address] [place] 3)  [address], [place] [phone]  [phone]   [facility] Child Abuse Hotline [phone] or [phone] (After Hours)

## 2014-05-07 NOTE — ED Notes (Signed)
TTS assessment ongoing at this time.   
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
## 2014-05-07 NOTE — ED Provider Notes (Signed)
CSN: [id number]     Arrival date & time 05/07/14  1434 History   First MD Initiated Contact with Patient 05/07/14 1544     Chief Complaint  Patient presents with  . Depression  . Anxiety   Joanne Baker is a 38 y.o. female with a history of depression, anxiety and asthma who presents the ED complaining of worsening depression and anxiety over the past month. She reports her depression anxiety is "too much to handle." Patient reports her depression and anxiety is worsened over the past 3 days. Patient has a therapist D.Monia PouchRiggs who wished her to come to the emergency department today. The patient reports she has been mixing lorazepam with alcohol at night to drink. Patient reports extrinsic couple bottles of wine each day for the past year. The patient reports she has not had anything to drink today.  Patient denies history of seizures or hallucinations or problems withdrawing from alcohol. Patient reports having trouble sleeping. Patient reports she is not sure when her last menstrual cycle was. The patient denies suicidal or homicidal ideations. Patient denies auditory or visual hallucinations. Patient denies fevers, chills, chest pain, palpitations, abdominal pain, nausea, vomiting, dysuria, hematuria.  (Consider location/radiation/quality/duration/timing/severity/associated sxs/prior Treatment) HPI  Past Medical History  Diagnosis Date  . Anxiety 2014  . Depression 2014   Past Surgical History  Procedure Laterality Date  . Cesarean section    . Knee surgery Left    Family History  Problem Relation Age of Onset  . Hypothyroidism Mother   . Arthritis Mother   . Cancer Father   . Diabetes Father   . Heart disease Father   . Cancer Sister   . Hypothyroidism Sister    History  Substance Use Topics  . Smoking status: Never Smoker   . Smokeless tobacco: Not on file  . Alcohol Use: 27.0 oz/week    5 Not specified, 40 Glasses of wine per week   OB History    No data available      Review of Systems  Constitutional: Negative for fever and chills.  HENT: Positive for congestion. Negative for ear pain, sore throat and trouble swallowing.   Eyes: Negative for itching and visual disturbance.  Respiratory: Negative for cough, shortness of breath and wheezing.   Cardiovascular: Negative for chest pain, palpitations and leg swelling.  Gastrointestinal: Negative for nausea, vomiting, abdominal pain and diarrhea.  Genitourinary: Negative for dysuria, hematuria, vaginal bleeding, vaginal discharge and difficulty urinating.  Musculoskeletal: Negative for back pain and neck pain.  Skin: Negative for rash.  Neurological: Negative for dizziness, seizures, speech difficulty, weakness, light-headedness, numbness and headaches.  Psychiatric/Behavioral: Positive for dysphoric mood. Negative for suicidal ideas, hallucinations, confusion, self-injury and agitation. The patient is nervous/anxious. The patient is not hyperactive.   All other systems reviewed and are negative.     Allergies  Review of patient's allergies indicates no known allergies.  Home Medications   Prior to Admission medications   Medication Sig Start Date End Date Taking? Authorizing Provider  albuterol (PROVENTIL HFA;VENTOLIN HFA) 108 (90 BASE) MCG/ACT inhaler Inhale 1-2 puffs into the lungs every 6 (six) hours as needed for wheezing or shortness of breath.   Yes Historical Provider, MD  beclomethasone (QVAR) 40 MCG/ACT inhaler Inhale 2 puffs into the lungs 2 (two) times daily.   Yes Historical Provider, MD  LORazepam (ATIVAN) 0.5 MG tablet Take 0.5-1 mg by mouth at bedtime as needed for anxiety or sleep.   Yes Historical Provider, MD  Pseudoephedrine-APAP-DM (  DAYQUIL PO) Take 1 capsule by mouth daily as needed (cough).   Yes Historical Provider, MD  acetaminophen-codeine (TYLENOL #3) 300-30 MG per tablet Take 1 tablet by mouth every 8 (eight) hours as needed for moderate pain. Patient not taking: Reported on  05/07/2014 01/30/14   Ambrose Finland, NP   BP 119/76 mmHg  Pulse 88  Temp(Src) 98.2 F (36.8 C) (Oral)  Resp 18  Ht 5\' 7"  (1.702 m)  Wt 168 lb (76.204 kg)  BMI 26.31 kg/m2  SpO2 100%  LMP  (LMP Unknown) Physical Exam  Constitutional: She is oriented to person, place, and time. She appears well-developed and well-nourished. No distress.  HENT:  Head: Normocephalic and atraumatic.  Right Ear: External ear normal.  Left Ear: External ear normal.  Nose: Nose normal.  Mouth/Throat: Oropharynx is clear and moist. No oropharyngeal exudate.  Eyes: Conjunctivae are normal. Pupils are equal, round, and reactive to light. Right eye exhibits no discharge. Left eye exhibits no discharge.  Neck: Neck supple.  Cardiovascular: Normal rate, regular rhythm, normal heart sounds and intact distal pulses.  Exam reveals no gallop and no friction rub.   No murmur heard. Pulmonary/Chest: Effort normal and breath sounds normal. No respiratory distress. She has no wheezes. She has no rales.  Abdominal: Soft. Bowel sounds are normal. She exhibits no distension and no mass. There is no tenderness. There is no rebound and no guarding.  Musculoskeletal: She exhibits no edema.  Lymphadenopathy:    She has no cervical adenopathy.  Neurological: She is alert and oriented to person, place, and time. Coordination normal.  Skin: Skin is warm and dry. No rash noted. She is not diaphoretic. No erythema. No pallor.  Psychiatric: She has a normal mood and affect. Her behavior is normal.  Patient's mood and affect are appropriate. Patient Joanne Baker 3. Patient denies suicidal or homicidal ideations. Patient denies visual or auditory hallucinations. Patient denies paranoia. The patient has good eye contact.   Nursing note and vitals reviewed.   ED Course  Procedures (including critical care time) Labs Review Labs Reviewed  COMPREHENSIVE METABOLIC PANEL - Abnormal; Notable for the following:    Potassium 3.5 (*)    BUN 4  (*)    All other components within normal limits  URINALYSIS, ROUTINE W REFLEX MICROSCOPIC - Abnormal; Notable for the following:    Color, Urine AMBER (*)    APPearance CLOUDY (*)    Bilirubin Urine SMALL (*)    Ketones, ur 15 (*)    Protein, ur 30 (*)    Leukocytes, UA TRACE (*)    All other components within normal limits  URINE MICROSCOPIC-ADD ON - Abnormal; Notable for the following:    Bacteria, UA FEW (*)    All other components within normal limits  URINE CULTURE  PREGNANCY, URINE  CBC  ETHANOL  URINE RAPID DRUG SCREEN (HOSP PERFORMED)    Imaging Review No results found.   EKG Interpretation None      Filed Vitals:   05/07/14 1509 05/07/14 1850 05/07/14 2050  BP: 139/94 120/83 119/76  Pulse:  90 88  Temp: 98.6 F (37 C) 98.5 F (36.9 C) 98.2 F (36.8 C)  TempSrc:  Oral Oral  Resp: 20 18 18   Height: 5\' 7"  (1.702 m)    Weight: 168 lb (76.204 kg)    SpO2: 99% 99% 100%     MDM   Meds given in ED:  Medications - No data to display  Discharge Medication List as  of 05/07/2014  8:22 PM      Final diagnoses:  Alcohol abuse  Depression  Anxiety  Joanne Baker is a 38 y.o. female with a history of depression, anxiety and asthma who presents the ED complaining of worsening depression and anxiety over the past month. 18:30 I spoke with Paige at [facility] who after speaking to the patient and the psychiatric NP they are recommending outpatient treatment for her alcohol use, depression and anxiety. The patient is not suicidal or homicidal. They will fax over a resource list for the patient.  Patient's CBC and CMP are unremarkable. The patient's ethanol level was negative. The patient's urine drug screen was negative. Patient had negative pregnancy test. The patient's urinalysis is remarkable only for trace leukocytes and few bacteria. Will send for culture. The patient denies dysuria, hematuria, urinary frequency or urgency.  The patient is afebrile and nontoxic  appearing. Patient still denies suicidal or homicidal ideations. Patient is okay with being discharged. The patient has a therapist to follow-up with. Advised patient to make a follow-up appointment with her therapist this week. Patient was provided resources for alcohol abuse and outpatient therapy. Advised patient follow up with her primary care provider as needed. I advised patient to return to the emergency department with new or worsening symptoms, suicidal or homicidal ideations or new concerns. Patient verbalized understanding and agreement with plan.  This patient was discussed with Dr. Rosalia Hammersay who agrees with assessment and plan.      Lawana ChambersWilliam Duncan Tiffanye Hartmann, PA-C 05/08/14 [id number]  Hilario Quarryanielle S Ray, MD 05/09/14 [phone]

## 2014-05-09 LAB — URINE CULTURE
COLONY COUNT: NO GROWTH
CULTURE: NO GROWTH

## 2015-06-06 NOTE — L&D Delivery Note (Signed)
Delivery Note At 0156 am a viable female, not yet named, was delivered via Vaginal, Spontaneous Delivery (Presentation: OA restituting to  ROA).  APGARS: 9, 9; weight 5 lbs 11.8 oz (2603 g).  Placenta status: Undelivered 40 min after delivery, despite active management protocol of 3rd stage. No bleeding noted, patient hemodynamically stable. Cervix had constricted to approx 4 cm dilation. No evidence of placental separation noted, uterus well-contracted. Dr. Sallye OberKulwa notified and present for removal.  Cord: WNL, with the following complications: None. Cord pH: NA  Anesthesia: None Episiotomy: None Lacerations: None Suture Repair: None Est. Blood Loss (mL): 250  Mom to OR due to retained placenta, then postpartum. Baby to Couplet care / Skin to Skin. Birth control not discussed.    Sherre ScarletKimberly Tyreka Henneke, CNM 03/06/16, 3:41 AM

## 2015-08-05 LAB — OB RESULTS CONSOLE ANTIBODY SCREEN: Antibody Screen: NEGATIVE

## 2015-08-05 LAB — OB RESULTS CONSOLE GC/CHLAMYDIA
Chlamydia: NEGATIVE
GC PROBE AMP, GENITAL: NEGATIVE

## 2015-08-05 LAB — OB RESULTS CONSOLE RPR: RPR: NONREACTIVE

## 2015-08-05 LAB — OB RESULTS CONSOLE HEPATITIS B SURFACE ANTIGEN: Hepatitis B Surface Ag: NEGATIVE

## 2015-08-05 LAB — OB RESULTS CONSOLE ABO/RH: RH TYPE: POSITIVE

## 2015-08-05 LAB — OB RESULTS CONSOLE HIV ANTIBODY (ROUTINE TESTING): HIV: NONREACTIVE

## 2015-08-05 LAB — OB RESULTS CONSOLE RUBELLA ANTIBODY, IGM: Rubella: IMMUNE

## 2015-10-19 ENCOUNTER — Other Ambulatory Visit (HOSPITAL_COMMUNITY): Payer: Self-pay | Admitting: Obstetrics & Gynecology

## 2015-10-19 DIAGNOSIS — O09522 Supervision of elderly multigravida, second trimester: Secondary | ICD-10-CM

## 2015-10-19 DIAGNOSIS — Z3689 Encounter for other specified antenatal screening: Secondary | ICD-10-CM

## 2015-10-19 DIAGNOSIS — Z3A18 18 weeks gestation of pregnancy: Secondary | ICD-10-CM

## 2015-10-19 DIAGNOSIS — O280 Abnormal hematological finding on antenatal screening of mother: Secondary | ICD-10-CM

## 2015-10-21 ENCOUNTER — Encounter (HOSPITAL_COMMUNITY): Payer: Self-pay

## 2015-10-22 ENCOUNTER — Other Ambulatory Visit (HOSPITAL_COMMUNITY): Payer: Self-pay | Admitting: Obstetrics & Gynecology

## 2015-10-22 ENCOUNTER — Ambulatory Visit (HOSPITAL_COMMUNITY)
Admission: RE | Admit: 2015-10-22 | Discharge: 2015-10-22 | Disposition: A | Discharge status: Home / Self Care | Payer: Medicaid Other | Source: Ambulatory Visit | Attending: Obstetrics & Gynecology | Admitting: Obstetrics & Gynecology

## 2015-10-22 ENCOUNTER — Encounter (HOSPITAL_COMMUNITY): Payer: Self-pay

## 2015-10-22 DIAGNOSIS — O352XX0 Maternal care for (suspected) hereditary disease in fetus, not applicable or unspecified: Secondary | ICD-10-CM

## 2015-10-22 DIAGNOSIS — Z315 Encounter for genetic counseling: Secondary | ICD-10-CM | POA: Insufficient documentation

## 2015-10-22 DIAGNOSIS — O283 Abnormal ultrasonic finding on antenatal screening of mother: Secondary | ICD-10-CM | POA: Diagnosis not present

## 2015-10-22 DIAGNOSIS — O09522 Supervision of elderly multigravida, second trimester: Secondary | ICD-10-CM

## 2015-10-22 DIAGNOSIS — Z3A18 18 weeks gestation of pregnancy: Secondary | ICD-10-CM

## 2015-10-22 DIAGNOSIS — O280 Abnormal hematological finding on antenatal screening of mother: Secondary | ICD-10-CM

## 2015-10-22 DIAGNOSIS — Z3689 Encounter for other specified antenatal screening: Secondary | ICD-10-CM

## 2015-10-22 DIAGNOSIS — Z36 Encounter for antenatal screening of mother: Secondary | ICD-10-CM | POA: Diagnosis not present

## 2015-10-22 DIAGNOSIS — O289 Unspecified abnormal findings on antenatal screening of mother: Secondary | ICD-10-CM

## 2015-10-22 HISTORY — DX: Unspecified asthma, uncomplicated: J45.909

## 2015-10-22 NOTE — Progress Notes (Signed)
Genetic Counseling  Visit Summary Note  Appointment Date: 10/22/2015 Referred By: Hoover Browns  Date of Birth: 11/18/1975  Pregnancy history: [id number] Estimated Date of Delivery: 03/23/16 Estimated Gestational Age: [redacted]w[redacted]d  Ms. Joanne Baker was seen for genetic counseling because of a maternal age of 40 y.o..  She was accompanied to the visit today by her niece and sister.   In summary:  Discussed increased risk for fetal aneuploidy with advanced maternal age  Reviewed results of patient's normal NIPS and ultrasound  Discussed limitations of screening and option of diagnostic testing  Declined amniocentesis  Discussed elevated MSAFP  Normal ultrasound  Reviewed family history concerns  Daughter with congenital heart defect  Sister with child with Jeune asphyxiating thoracic dystrophy  She was counseled regarding maternal age and the association with risk for chromosome conditions due to nondisjunction with aging of the ova.   We reviewed chromosomes, nondisjunction, and the associated 1 in 61 risk for fetal aneuploidy related to a maternal age of 40 y.o. at [redacted]w[redacted]d gestation.  She was counseled that the risk for aneuploidy decreases as gestational age increases, accounting for those pregnancies which spontaneously abort.  We specifically discussed Down syndrome (trisomy 78), trisomies 108 and 41, and sex chromosome aneuploidies (47,XXX and 47,XXY) including the common features and prognoses of each.   We also reviewed the results of Ms. Joanne Baker's non-invasive prenatal screening (NIPS) and ultrasound.  We discussed that NIPS analyzes placental cell free DNA in maternal circulation to evaluate for the presence of extra chromosome conditions.  Thus, it is able to provide risk assessment for specific chromosome conditions, but is not diagnostic.  Specifically, Ms. Joanne Baker had Panorama screening.  Based on this test, the chance for her baby to have aneuploidy for chromosomes 13, 18, 21, or X  was reduced to less than 1 in 45409.   She was also counseled that 50-80% of fetuses with Down syndrome and up to 90% of fetuses with trisomies 13 and 18, when well visualized, have detectable anomalies or soft markers by ultrasound.  We discussed that her ultrasound did not identify any fetal anomalies or soft markers of aneuploidy.  We also discussed the availability of diagnostic testing by way of amniocentesis.  We reviewed the risks, benefits and limitations of amniocentesis including the approximate 1 in 300-500 risk for pregnancy complications following amniocentesis.   Ms. Joanne Baker also had an MSAFP drawn for evaluation of open fetal defects.  The result was found to be elevated at 2.85 MoM.  This was associated with a 1 in 237 chance for an open fetal defect.  We reviewed her MSAFP results in detail.  We discussed that there are many explanations for an elevated AFP including: twins, dating errors, normal variant, ONTD's, abdominal wall defects, kidney and placental differences, oligohydramnios, and transient feto-maternal bleed.  A complete ultrasound was performed today. Please see report documented separately.  In brief, we discussed that there were no concerns noted at this time.  She was counseled that an unexplained elevation of MSAFP is associated with an increased risk for third trimester complications including: prematurity, low birth weight, and pre-eclampsia.   After reviewing the above results and the available options, Ms. Joanne Baker expressed that she is not interested in pursuing diagnostic testing at this time or in the future, given the associated risk of complications.  She understands that ultrasound and NIPS cannot rule out all birth defects or genetic syndromes.    Ms. Joanne Baker was provided with written information regarding sickle cell  anemia (SCA) including the carrier frequency and incidence in the African-American population, the availability of carrier testing and prenatal  diagnosis if indicated.  In addition, we discussed that hemoglobinopathies are routinely screened for as part of the [place] newborn screening panel.  Medical records indicate that hemoglobin electrophoresis was previously performed and reported to be normal.  Both family histories were reviewed.  Ms. Joanne Baker reported that her first child was born with transposition of the great vessels. We discussed that isolated congenital heart defects are thought to follow a multifactorial pattern of inheritance.  As such, the chance for another child to have a congenital heart birth defect would be approximately 3-5%.  Ms. Joanne Baker is already scheduled for fetal echocardiogram.  She is aware that not all congenital heart defects can be identified prenatally.  Ms. Joanne Baker's sister reported that her second child died shortly after birth from Jeune asphyxiating thoracic dystrophy.  This is an inherited disorder of bone growth characterized by a narrow chest, short ribs, shortened bones in the arms and legs, short stature, and polydactyly.  Most infants are born with an extremely narrow, bell-shaped chest that can restrict the growth and expansion of the lungs.  It is inherited as an autosomal recessive condition.  As such, Ms. Joanne Baker would have a 1 in 2 or 50% chance to be a carrier of this condition.  She understands that both she and the father of the baby would have to be carriers in order for there to be an increased risk to have a child with the same condition.  The general population chance to be a carrier of this condition is approximately 1 in 150 or less.  Overall, the chance for an affected child would be less than 1 in 1000.  Mutations in at least 11 genes have been found to be responsible for this condition complicating genetic testing.  The remainder of the family histories were reviewed and found to be noncontributory for birth defects, mental retardation, and known genetic conditions. Without further information  regarding the provided family history, an accurate genetic risk cannot be calculated. Further genetic counseling is warranted if more information is obtained.  Ms. Joanne Baker denied exposure to environmental toxins or chemical agents. She denied the use of alcohol, tobacco or street drugs. She denied significant viral illnesses during the course of her pregnancy. Her medical and surgical histories were noncontributory.   I counseled Ms. Joanne Baker regarding the above risks and available options.  The approximate face-to-face time with the genetic counselor was 45 minutes.  Joanne Gemmaaragh Kamori Kitchens, MS,  Certified Genetic Counselor

## 2015-10-26 ENCOUNTER — Other Ambulatory Visit (HOSPITAL_COMMUNITY): Payer: Self-pay

## 2015-12-30 LAB — OB RESULTS CONSOLE RPR: RPR: NONREACTIVE

## 2016-02-26 ENCOUNTER — Observation Stay (HOSPITAL_COMMUNITY): Payer: Medicaid Other

## 2016-02-26 ENCOUNTER — Inpatient Hospital Stay (HOSPITAL_COMMUNITY): Payer: Medicaid Other

## 2016-02-26 ENCOUNTER — Inpatient Hospital Stay (HOSPITAL_COMMUNITY)
Admission: AD | Admit: 2016-02-26 | Discharge: 2016-02-29 | DRG: 781 | Disposition: A | Discharge status: Home / Self Care | Payer: Medicaid Other | Source: Ambulatory Visit | Attending: Obstetrics & Gynecology | Admitting: Obstetrics & Gynecology

## 2016-02-26 ENCOUNTER — Encounter (HOSPITAL_COMMUNITY): Payer: Self-pay | Admitting: *Deleted

## 2016-02-26 DIAGNOSIS — O4593 Premature separation of placenta, unspecified, third trimester: Principal | ICD-10-CM | POA: Diagnosis present

## 2016-02-26 DIAGNOSIS — Z833 Family history of diabetes mellitus: Secondary | ICD-10-CM

## 2016-02-26 DIAGNOSIS — Z8249 Family history of ischemic heart disease and other diseases of the circulatory system: Secondary | ICD-10-CM

## 2016-02-26 DIAGNOSIS — O99513 Diseases of the respiratory system complicating pregnancy, third trimester: Secondary | ICD-10-CM | POA: Diagnosis present

## 2016-02-26 DIAGNOSIS — O4693 Antepartum hemorrhage, unspecified, third trimester: Secondary | ICD-10-CM | POA: Diagnosis not present

## 2016-02-26 DIAGNOSIS — O288 Other abnormal findings on antenatal screening of mother: Secondary | ICD-10-CM

## 2016-02-26 DIAGNOSIS — O99013 Anemia complicating pregnancy, third trimester: Secondary | ICD-10-CM | POA: Diagnosis present

## 2016-02-26 DIAGNOSIS — D649 Anemia, unspecified: Secondary | ICD-10-CM | POA: Diagnosis present

## 2016-02-26 DIAGNOSIS — O34219 Maternal care for unspecified type scar from previous cesarean delivery: Secondary | ICD-10-CM | POA: Diagnosis present

## 2016-02-26 DIAGNOSIS — Z3A36 36 weeks gestation of pregnancy: Secondary | ICD-10-CM

## 2016-02-26 DIAGNOSIS — J45909 Unspecified asthma, uncomplicated: Secondary | ICD-10-CM | POA: Diagnosis present

## 2016-02-26 LAB — COMPREHENSIVE METABOLIC PANEL
ALBUMIN: 3.2 g/dL — AB (ref 3.5–5.0)
ALT: 17 U/L (ref 14–54)
ANION GAP: 8 (ref 5–15)
AST: 20 U/L (ref 15–41)
Alkaline Phosphatase: 114 U/L (ref 38–126)
BUN: 7 mg/dL (ref 6–20)
CHLORIDE: 108 mmol/L (ref 101–111)
CO2: 19 mmol/L — AB (ref 22–32)
Calcium: 9.1 mg/dL (ref 8.9–10.3)
Creatinine, Ser: 0.54 mg/dL (ref 0.44–1.00)
GFR calc Af Amer: >60 mL/min (ref >60)
GFR calc non Af Amer: >60 mL/min (ref >60)
GLUCOSE: 81 mg/dL (ref 65–99)
POTASSIUM: 3.8 mmol/L (ref 3.5–5.1)
Sodium: 135 mmol/L (ref 135–145)
Total Bilirubin: 0.7 mg/dL (ref 0.3–1.2)
Total Protein: 6.9 g/dL (ref 6.5–8.1)

## 2016-02-26 LAB — CBC
HCT: 34.2 % — ABNORMAL LOW (ref 36.0–46.0)
HCT: 34.4 % — ABNORMAL LOW (ref 36.0–46.0)
HEMOGLOBIN: 12 g/dL (ref 12.0–15.0)
HEMOGLOBIN: 12.1 g/dL (ref 12.0–15.0)
MCH: 31 pg (ref 26.0–34.0)
MCH: 31.1 pg (ref 26.0–34.0)
MCHC: 35.1 g/dL (ref 30.0–36.0)
MCHC: 35.2 g/dL (ref 30.0–36.0)
MCV: 88.4 fL (ref 78.0–100.0)
MCV: 88.4 fL (ref 78.0–100.0)
PLATELETS: 220 10*3/uL (ref 150–400)
Platelets: 223 10*3/uL (ref 150–400)
RBC: 3.87 MIL/uL (ref 3.87–5.11)
RBC: 3.89 MIL/uL (ref 3.87–5.11)
RDW: 12.9 % (ref 11.5–15.5)
RDW: 12.8 % (ref 11.5–15.5)
WBC: 7.3 10*3/uL (ref 4.0–10.5)
WBC: 9.9 10*3/uL (ref 4.0–10.5)

## 2016-02-26 LAB — TYPE AND SCREEN
ABO/RH(D): B POS
Antibody Screen: NEGATIVE

## 2016-02-26 LAB — OB RESULTS CONSOLE GBS: GBS: NEGATIVE

## 2016-02-26 LAB — ABO/RH: ABO/RH(D): B POS

## 2016-02-26 MED ORDER — CALCIUM CARBONATE ANTACID 500 MG PO CHEW
2.0000 | CHEWABLE_TABLET | ORAL | Status: DC | PRN
  Administered 2016-02-27: 400 mg via ORAL
  Filled 2016-02-26: qty 2

## 2016-02-26 MED ORDER — DOCUSATE SODIUM 100 MG PO CAPS
100.0000 mg | ORAL_CAPSULE | Freq: Every day | ORAL | Status: DC
  Administered 2016-02-28: 100 mg via ORAL
  Filled 2016-02-26 (×2): qty 1

## 2016-02-26 MED ORDER — ACETAMINOPHEN 325 MG PO TABS: 650.0000 mg | ORAL_TABLET | ORAL | Status: DC | PRN

## 2016-02-26 MED ORDER — ZOLPIDEM TARTRATE 5 MG PO TABS
5.0000 mg | ORAL_TABLET | Freq: Every evening | ORAL | Status: DC | PRN
  Administered 2016-02-27 – 2016-02-28 (×2): 5 mg via ORAL
  Filled 2016-02-26 (×2): qty 1

## 2016-02-26 MED ORDER — LACTATED RINGERS IV SOLN
INTRAVENOUS | Status: DC
  Administered 2016-02-26 – 2016-02-28 (×4): via INTRAVENOUS

## 2016-02-26 MED ORDER — LACTATED RINGERS IV SOLN
INTRAVENOUS | Status: DC
  Administered 2016-02-26 (×2): via INTRAVENOUS

## 2016-02-26 MED ORDER — PRENATAL MULTIVITAMIN CH
1.0000 | ORAL_TABLET | Freq: Every day | ORAL | Status: DC
  Administered 2016-02-27 – 2016-02-28 (×2): 1 via ORAL
  Filled 2016-02-26 (×2): qty 1

## 2016-02-26 NOTE — MAU Note (Signed)
Pt has been wearing a pad for 30 minutes and it has a stripe of blood down the middle.  Pt states the bleeding started about 15 minutes before she put the pad on.  Pt states that she had a lot of blood and mucus on the sheets of her bed.  Pt states she is feeling the baby move.  Pt denies any leaking of fluid.  Pt states she is planning a VBAC.

## 2016-02-26 NOTE — Progress Notes (Signed)
Joanne Baker is a 40 y.o. [id number] at 9670w4d admitted for third trimester bleeding  Subjective: Feels contractions have spaced and denies pain.  Reports only spotting.  Pad has only small amount of brown discharge.  Denies LOF.  Objective: BP 116/74   Pulse 80   Temp 98.2 F (36.8 C) (Oral)   Resp 18   Ht 5' 6.5" (1.689 m)   Wt 183 lb (83 kg)   LMP 06/17/2015   SpO2 97%   BMI 29.09 kg/m  No intake/output data recorded. No intake/output data recorded.  FHT:  FHR: 140 bpm, variability: moderate,  accelerations:  Present,  decelerations:  Absent UC:   irregular, every 7-8 minutes SVE:   Dilation: 4 Effacement (%): 80 Station: -2 Exam by:: Almeta Geisel  Labs: Lab Results  Component Value Date   WBC 9.9 02/26/2016   HGB 12.1 02/26/2016   HCT 34.4 (L) 02/26/2016   MCV 88.4 02/26/2016   PLT 223 02/26/2016    Assessment / Plan: Admitted with third trimester bleeding.  Partial abruption vs excessive bloody show (per pt report)  Labor: not currently laboring and bleeding minimal Preeclampsia:  no signs or symptoms of toxicity Fetal Wellbeing:  Category I Pain Control:  none I/D:  GBS neg Anticipated MOD:  desires TOLAC but currently just being observed  Cletis Clack Y 02/26/2016, 5:40 PM

## 2016-02-26 NOTE — MAU Provider Note (Signed)
History     CSN: [id number]  Arrival date and time: 02/26/16 1006   First Provider Initiated Contact with Patient 02/26/16 1028      Chief Complaint  Patient presents with  . Vaginal Bleeding   HPI   Ms.Joanne Baker is a 40 y.o. female [phone] @ [redacted]w[redacted]d here in MAU with bright red vaginal bleeding. The bleeding started today during intercourse. "There was blood all over my bed".  She is having off and on pain in her abdomen, the pain is similar to contractions.   + fetal movements at this time.   OB History    Gravida Para Term Preterm AB Living   7 3 2 1 3 3    SAB TAB Ectopic Multiple Live Births   1 2     3       Past Medical History:  Diagnosis Date  . Anxiety 2014  . Asthma   . Depression 2014    Past Surgical History:  Procedure Laterality Date  . CESAREAN SECTION    . KNEE SURGERY Left     Family History  Problem Relation Age of Onset  . Hypothyroidism Mother   . Arthritis Mother   . Cancer Father   . Diabetes Father   . Heart disease Father   . Cancer Sister   . Hypothyroidism Sister     Social History  Substance Use Topics  . Smoking status: Never Smoker  . Smokeless tobacco: Never Used  . Alcohol use 3.0 oz/week    5 Standard drinks or equivalent per week    Allergies: No Known Allergies  Prescriptions Prior to Admission  Medication Sig Dispense Refill Last Dose  . albuterol (PROVENTIL HFA;VENTOLIN HFA) 108 (90 BASE) MCG/ACT inhaler Inhale 1-2 puffs into the lungs every 6 (six) hours as needed for wheezing or shortness of breath.    Past Month at Unknown time  . Cholecalciferol (VITAMIN D) 2000 units CAPS Take 4,000 Units by mouth daily.   02/25/2016 at Unknown time  . diphenhydrAMINE (BENADRYL) 25 MG tablet Take 25 mg by mouth at bedtime as needed for sleep.   Past Week at Unknown time  . montelukast (SINGULAIR) 10 MG tablet Take 10 mg by mouth daily.   02/25/2016 at Unknown time  . pantoprazole (PROTONIX) 40 MG tablet Take 40 mg by mouth  daily.   02/25/2016 at Unknown time  . Prenatal Vit-Fe Fumarate-FA (PRENATAL MULTIVITAMIN) TABS tablet Take 1 tablet by mouth daily.   02/25/2016 at Unknown time    Results for orders placed or performed during the hospital encounter of 02/26/16 (from the past 24 hour(s))  CBC     Status: Abnormal   Collection Time: 02/26/16 11:31 AM  Result Value Ref Range   WBC 7.3 4.0 - 10.5 K/uL   RBC 3.87 3.87 - 5.11 MIL/uL   Hemoglobin 12.0 12.0 - 15.0 g/dL   HCT 44.0 (L) 10.2 - 72.5 %   MCV 88.4 78.0 - 100.0 fL   MCH 31.0 26.0 - 34.0 pg   MCHC 35.1 30.0 - 36.0 g/dL   RDW 36.6 44.0 - 34.7 %   Platelets 220 150 - 400 K/uL  Comprehensive metabolic panel     Status: Abnormal   Collection Time: 02/26/16 11:31 AM  Result Value Ref Range   Sodium 135 135 - 145 mmol/L   Potassium 3.8 3.5 - 5.1 mmol/L   Chloride 108 101 - 111 mmol/L   CO2 19 (L) 22 - 32 mmol/L   Glucose,  Bld 81 65 - 99 mg/dL   BUN 7 6 - 20 mg/dL   Creatinine, Ser 1.610.54 0.44 - 1.00 mg/dL   Calcium 9.1 8.9 - 09.610.3 mg/dL   Total Protein 6.9 6.5 - 8.1 g/dL   Albumin 3.2 (L) 3.5 - 5.0 g/dL   AST 20 15 - 41 U/L   ALT 17 14 - 54 U/L   Alkaline Phosphatase 114 38 - 126 U/L   Total Bilirubin 0.7 0.3 - 1.2 mg/dL   GFR calc non Af Amer >60 >60 mL/min   GFR calc Af Amer >60 >60 mL/min   Anion gap 8 5 - 15  CBC     Status: Abnormal   Collection Time: 02/26/16  1:50 PM  Result Value Ref Range   WBC 9.9 4.0 - 10.5 K/uL   RBC 3.89 3.87 - 5.11 MIL/uL   Hemoglobin 12.1 12.0 - 15.0 g/dL   HCT 04.534.4 (L) 40.936.0 - 81.146.0 %   MCV 88.4 78.0 - 100.0 fL   MCH 31.1 26.0 - 34.0 pg   MCHC 35.2 30.0 - 36.0 g/dL   RDW 91.412.8 78.211.5 - 95.615.5 %   Platelets 223 150 - 400 K/uL  Type and screen [facility]     Status: None   Collection Time: 02/26/16  1:50 PM  Result Value Ref Range   ABO/RH(D) B POS    Antibody Screen NEG    Sample Expiration 02/29/2016     Review of Systems  Constitutional: Negative for chills and fever.  Gastrointestinal:  Positive for abdominal pain.   Physical Exam   Blood pressure 115/77, pulse 86, temperature 97.9 F (36.6 C), temperature source Oral, resp. rate 18, last menstrual period 06/17/2015, SpO2 97 %.  Physical Exam  Constitutional: She is oriented to person, place, and time. She appears well-developed and well-nourished. No distress.  HENT:  Head: Normocephalic.  Eyes: Pupils are equal, round, and reactive to light.  GI: Soft. She exhibits no distension. There is no tenderness. There is no rebound.  Genitourinary:  Genitourinary Comments: Vagina - Small amount of dark red blood in the vaginal canal, no odor  Cervix -no active bleeding  Bimanual exam: Cervix  Dilation: 4 Effacement (%): 80 Cervical Position: Middle Station: -2 Presentation: Vertex Exam by:: roberts Chaperone present for exam.   Musculoskeletal: Normal range of motion.  Neurological: She is alert and oriented to person, place, and time.  Skin: Skin is warm. She is not diaphoretic.  Psychiatric: Her behavior is normal.    Dilation: 4 Effacement (%): 80 Cervical Position: Middle Station: -2 Presentation: Vertex Exam by:: [email or web address],rnc   Fetal Tracing: Baseline: 135 bpm Variability: Moderate  Accelerations: 15x15 Decelerations: none Toco: occasional   MAU Course  Procedures  None  MDM  B positive blood type Bedside US ordered  Discussed US results with Dr. Su Hiltoberts, she would like a CBC, CMP and BPP. Dr. Su Hiltoberts notified of BPP results 8/8, CBC and CMP results. Patient continues to complain of small amount of bleeding noted when she wipes, with small clots noted in the toilet. No blood noted on patients peri pad.  Assessment and Plan   A:  1. Vaginal bleeding in pregnancy, third trimester   2. AFI (amniotic fluid index) borderline low    P:  Admit to labor and delivery per Dr. Albertina Parroberts  Jennifer I Rasch, NP 02/26/2016 6:56 PM

## 2016-02-26 NOTE — H&P (Signed)
Joanne Baker is a 40 y.o. female presenting for bleeding with some pain intermittently.  Reports +FM.  OB History    Gravida Para Term Preterm AB Living   7 3 2 1 3 3    SAB TAB Ectopic Multiple Live Births   1 2     3      Past Medical History:  Diagnosis Date  . Anxiety 2014  . Asthma   . Depression 2014   Past Surgical History:  Procedure Laterality Date  . CESAREAN SECTION    . KNEE SURGERY Left    Family History: family history includes Arthritis in her mother; Cancer in her father and sister; Diabetes in her father; Heart disease in her father; Hypothyroidism in her mother and sister. Social History:  reports that she has never smoked. She has never used smokeless tobacco. She reports that she drinks about 3.0 oz of alcohol per week . She reports that she does not use drugs.     Maternal Diabetes: No Genetic Screening: Abnormal:  Results: Elevated AFP, Normal Panorama, low risk female Maternal Ultrasounds/Referrals: Normal MFM u/s nl as well with nl spine, posterior fossa, cerebellum and cord insertion Fetal Ultrasounds or other Referrals:  None Maternal Substance Abuse:  No Significant Maternal Medications:  None Significant Maternal Lab Results:  Lab values include: Group B Strep negative Other Comments:  None  ROS  Non-contributory  History Dilation: 4 Effacement (%): 80 Station: -2 Exam by:: [email or web address],rnc Blood pressure 115/77, pulse 98, temperature 97.9 F (36.6 C), temperature source Oral, resp. rate 18, last menstrual period 06/17/2015, SpO2 97 %. Exam Physical Exam  Lungs CTA CV RRR Abd gravid, NT Ext no calf tenderness FHT cat 1 Toco q 4-56min  Prenatal labs: ABO, Rh:  B + Antibody:  Neg Rubella:  Immune RPR:   NR HBsAg:   Neg HIV:   NR GBS:   neg  Assessment/Plan: P3 at 8236 4/7wks with h/o 2 vaginal deliveries and then a 3rd delivery via c/s secondary to abruption presenting with third trimester bleeding.  Labor vs partial abruption.  Hgb  is good and fetal status is reassuring with cat 1 tracing.  Report from APP in MAU reported no active bleeding upon exam but after wearing a pad small occasional clots.  Will admit for observation.  Pt desires TOLAC.     Purcell NailsROBERTS,Joanne Baker Y 02/26/2016, 3:18 PM

## 2016-02-27 LAB — CBC
HEMATOCRIT: 30.4 % — AB (ref 36.0–46.0)
Hemoglobin: 10.5 g/dL — ABNORMAL LOW (ref 12.0–15.0)
MCH: 30.6 pg (ref 26.0–34.0)
MCHC: 34.5 g/dL (ref 30.0–36.0)
MCV: 88.6 fL (ref 78.0–100.0)
Platelets: 190 10*3/uL (ref 150–400)
RBC: 3.43 MIL/uL — ABNORMAL LOW (ref 3.87–5.11)
RDW: 12.9 % (ref 11.5–15.5)
WBC: 7 10*3/uL (ref 4.0–10.5)

## 2016-02-27 LAB — RPR: RPR: NONREACTIVE

## 2016-02-27 MED ORDER — PANTOPRAZOLE SODIUM 40 MG PO TBEC
40.0000 mg | DELAYED_RELEASE_TABLET | Freq: Every day | ORAL | Status: DC
  Administered 2016-02-27 – 2016-02-28 (×2): 40 mg via ORAL
  Filled 2016-02-27 (×2): qty 1

## 2016-02-27 MED ORDER — BUTORPHANOL TARTRATE 1 MG/ML IJ SOLN: 1.0000 mg | Freq: Once | INTRAMUSCULAR | Status: DC | PRN

## 2016-02-27 MED ORDER — MONTELUKAST SODIUM 10 MG PO TABS
10.0000 mg | ORAL_TABLET | Freq: Every day | ORAL | Status: DC
  Administered 2016-02-27 – 2016-02-28 (×2): 10 mg via ORAL
  Filled 2016-02-27 (×3): qty 1

## 2016-02-27 NOTE — Progress Notes (Addendum)
Pt noted to have regular ctxs that began @ 1237 every 4-5.5 minutes.  Pt reports she's able to feel the ctxs but they are not uncomfortable.

## 2016-02-27 NOTE — Progress Notes (Signed)
Joanne Baker A Female, 40 y/o, 1976-03-18   Subjective: Patient reports no recent vaginal bleeding.  Feels occasional contractions.  No loss of fluid.  With normal gross fetal movement.    Objective: I have reviewed patient's medications and radiology results. Blood pressure 123/76, pulse 90, temperature 98.3 F (36.8 C), temperature source Oral, resp. rate 16, height 5' 6.5" (1.689 m), weight 83 kg (183 lb), last menstrual period 06/17/2015, SpO2 97 %. General: alert and cooperative Resp: clear to auscultation bilaterally Cardio: regular rate and rhythm, S1, S2 normal, no murmur, click, rub or gallop GI: soft, non-tender; bowel sounds normal; no masses,  no organomegaly   9/24: BPP 8/10 (-2 for gross fetal movement)  CBC Latest Ref Rng & Units 02/27/2016 02/26/2016 02/26/2016  WBC 4.0 - 10.5 K/uL 7.0 9.9 7.3  Hemoglobin 12.0 - 15.0 g/dL 10.5(L) 12.1 12.0  Hematocrit 36.0 - 46.0 % 30.4(L) 34.4(L) 34.2(L)  Platelets 150 - 400 K/uL 190 223 220   NST @ 0832 am: 150 BL, mod variabilty, reactive TOCO: Irritability, no distinct contractions.  CVX: 4cm/50%/-3, unchanged.    Assessment/Plan:  Third trimester bleeding suspect placenta abruption  -Continue with inpatient management.  -Repeat BPP tomorrow.  -Consider discharge on Tuesday if remains stable and without further bleeding.    LOS: 1 day    [facility] PointKULWA,Joanne Baker [facility] IncWAKURU 02/27/2016, 9:14 AM

## 2016-02-27 NOTE — Progress Notes (Signed)
Maternal pulse tracing from 0749 to [phone]

## 2016-02-27 NOTE — Progress Notes (Signed)
Chaplain visit the result of a [name] to assist with an Advanced Directive. Chaplain met with Joanne Baker and she indicated she was not ready to execute a directive at this time, She may be discharged, she does not wish to remain in the hospital longer to complete the paperwork. She is aware that this service is available whenever she is in the hospital for any reason.  Sallee Lange. Kasen Adduci, DMin Weekend Chaplain

## 2016-02-28 ENCOUNTER — Observation Stay (HOSPITAL_COMMUNITY): Payer: Medicaid Other

## 2016-02-28 DIAGNOSIS — Z833 Family history of diabetes mellitus: Secondary | ICD-10-CM | POA: Diagnosis not present

## 2016-02-28 DIAGNOSIS — O4593 Premature separation of placenta, unspecified, third trimester: Secondary | ICD-10-CM | POA: Diagnosis present

## 2016-02-28 DIAGNOSIS — O99013 Anemia complicating pregnancy, third trimester: Secondary | ICD-10-CM | POA: Diagnosis present

## 2016-02-28 DIAGNOSIS — Z3A36 36 weeks gestation of pregnancy: Secondary | ICD-10-CM | POA: Diagnosis not present

## 2016-02-28 DIAGNOSIS — O99513 Diseases of the respiratory system complicating pregnancy, third trimester: Secondary | ICD-10-CM | POA: Diagnosis present

## 2016-02-28 DIAGNOSIS — J45909 Unspecified asthma, uncomplicated: Secondary | ICD-10-CM | POA: Diagnosis present

## 2016-02-28 DIAGNOSIS — D649 Anemia, unspecified: Secondary | ICD-10-CM | POA: Diagnosis present

## 2016-02-28 DIAGNOSIS — Z8249 Family history of ischemic heart disease and other diseases of the circulatory system: Secondary | ICD-10-CM | POA: Diagnosis not present

## 2016-02-28 DIAGNOSIS — O4693 Antepartum hemorrhage, unspecified, third trimester: Secondary | ICD-10-CM | POA: Diagnosis present

## 2016-02-28 DIAGNOSIS — O34219 Maternal care for unspecified type scar from previous cesarean delivery: Secondary | ICD-10-CM | POA: Diagnosis present

## 2016-02-28 NOTE — Progress Notes (Signed)
Pt off floor to ultrasound. FHR in stable. No vaginal bleeding noted. Carmelina DaneERRI L Deyani Hegarty, RN

## 2016-02-28 NOTE — Progress Notes (Signed)
Dr. Sallye OberKulwa notified with patient's request to shower. Dr. Sallye OberKulwa states that patient is able to shower. No vaginal bleeding is noted. Carmelina DaneERRI L Sivan Quast, RN

## 2016-02-28 NOTE — Progress Notes (Signed)
Pt awaiting to go to ultrasound for BPP. Pt reports no vaginal bleeding. Uterine irritability noted. FHR stable @125  with accels. Will continue to monitor. Carmelina DaneERRI L Hamp Moreland, RN

## 2016-02-28 NOTE — Progress Notes (Signed)
Joanne Baker, Joanne Baker, 40 y.o., [birth year]  Third trimeter bleeding, suspect placenta abruption   Subjective: Patient reports no complaints.  Denies vaginal bleeding or leakage of fluid.  Continues with irregular contractions.  With normal gross fetal movement.   Objective: I have reviewed patient's vital signs and radiology results. Blood pressure 102/72, pulse 84, temperature 98.3 F (36.8 C), temperature source Oral, resp. rate 16, height 5' 6.5" (1.689 m), weight 83 kg (183 lb), last menstrual period 06/17/2015, SpO2 98 %. General: alert, cooperative and no distress Resp: clear to auscultation bilaterally Cardio: regular rate and rhythm, S1, S2 normal, no murmur, click, rub or gallop GI: soft, non-tender; bowel sounds normal; no masses,  no organomegaly Extremities: extremities normal, atraumatic, no cyanosis or edema Vaginal Bleeding: minimal and small light brown/pink blood seen on peripad  NST: Category 1 TOCO: Irregular contractions.  CVX: deferred BPP ultrasound today: 8/8  Assessment/Plan:  40 y/o [id number] at 7036 W 6 days EGA with third trimester bleeding, suspect placenta abruption, stable maternal and fetal status since admission without further vaginal bleeding  -Continue with current management.  -Consider discharge on Tuesday (9/26) if she remains stable and without further bleeding.  -Hx of 1 C/S and for TOLAC in this pregnancy if she goes into labor -Labor and bleeding precautions reviewed.  Konrad FelixKULWA,Ayanni Tun WAKURU, MD.  02/28/2016, 8:44 AM

## 2016-02-28 NOTE — Progress Notes (Signed)
Pt back on EFM post shower. FHR 145. Joanne Baker L Satin Boal

## 2016-02-29 NOTE — Discharge Summary (Signed)
[facility]  Joanne Baker  DOB:    07/03/1975 MRN:    [id number] CSN:    [id number]  Date of admission:                  02/26/2016  Date of discharge:                   02/29/2016  Admission Diagnosis:  6423w0d  Prior cesarean section  Vaginal bleeding that was thought to represent either bloody show or a placental abruption  Uterine contractions  Anemia  History of anxiety  Discharge Diagnosis:  Same  Procedures this admission:  None  History of Present Illness:  Ms. Joanne BonierBrandi A Myron is a 40 y.o. female, [id number], who presents at 1423w0d weeks gestation. [facility] She was admitted observation/evaluation. Her pregnancy has been complicated by: A prior cesarean section.  Hospital course:  The patient was admitted for observation.   On her initial evaluation no bleeding was noted. A biophysical profile was noted to be 6 out of 8. The decision was made to observe the patient. A repeat biophysical profile was performed on 02/28/2016. It was 8 out of 8. The patient denies further bleeding. She denies having difficulty this morning. She was discharged to home doing well.  Discharge Physical Exam:  BP 102/72 (BP Location: Left Arm)   Pulse 84   Temp 98.3 F (36.8 C) (Oral)   Resp 16   Ht 5' 6.5" (1.689 m)   Wt 183 lb (83 kg)   LMP 06/17/2015   SpO2 98%   BMI 29.09 kg/m    Abdomen: Soft and nontender  Exam deferred.   Fetal heart rate tracing: Category 1  Discharge Information:  Activity:           unrestricted Diet:                routine Medications: PNV Condition:      stable Instructions:  Call for bleeding or concerns. Fetal kick counts instruction sheet was given. Discharge to: home  Follow-up Information    [facility] Follow up in 2 day(s).   Specialty:   Obstetrics and Gynecology Contact information: [address] [place] [address] [phone]            Janine LimboSTRINGER,Flynn Gwyn V 02/29/2016

## 2016-02-29 NOTE — Discharge Instructions (Signed)
Fetal Movement Counts °Patient Name: __________________________________________________ Patient Due Date: ____________________ °Performing a fetal movement count is highly recommended in high-risk pregnancies, but it is good for every pregnant woman to do. Your health care provider may ask you to start counting fetal movements at 28 weeks of the pregnancy. Fetal movements often increase: °· After eating a full meal. °· After physical activity. °· After eating or drinking something sweet or cold. °· At rest. °Pay attention to when you feel the baby is most active. This will help you notice a pattern of your baby's sleep and wake cycles and what factors contribute to an increase in fetal movement. It is important to perform a fetal movement count at the same time each day when your baby is normally most active.  °HOW TO COUNT FETAL MOVEMENTS °1. Find a quiet and comfortable area to sit or lie down on your left side. Lying on your left side provides the best blood and oxygen circulation to your baby. °2. Write down the day and time on a sheet of paper or in a journal. °3. Start counting kicks, flutters, swishes, rolls, or jabs in a 2-hour period. You should feel at least 10 movements within 2 hours. °4. If you do not feel 10 movements in 2 hours, wait 2-3 hours and count again. Look for a change in the pattern or not enough counts in 2 hours. °SEEK MEDICAL CARE IF: °· You feel less than 10 counts in 2 hours, tried twice. °· There is no movement in over an hour. °· The pattern is changing or taking longer each day to reach 10 counts in 2 hours. °· You feel the baby is not moving as he or she usually does. °Date: ____________ Movements: ____________ Start time: ____________ Finish time: ____________  °Date: ____________ Movements: ____________ Start time: ____________ Finish time: ____________ °Date: ____________ Movements: ____________ Start time: ____________ Finish time: ____________ °Date: ____________ Movements:  ____________ Start time: ____________ Finish time: ____________ °Date: ____________ Movements: ____________ Start time: ____________ Finish time: ____________ °Date: ____________ Movements: ____________ Start time: ____________ Finish time: ____________ °Date: ____________ Movements: ____________ Start time: ____________ Finish time: ____________ °Date: ____________ Movements: ____________ Start time: ____________ Finish time: ____________  °Date: ____________ Movements: ____________ Start time: ____________ Finish time: ____________ °Date: ____________ Movements: ____________ Start time: ____________ Finish time: ____________ °Date: ____________ Movements: ____________ Start time: ____________ Finish time: ____________ °Date: ____________ Movements: ____________ Start time: ____________ Finish time: ____________ °Date: ____________ Movements: ____________ Start time: ____________ Finish time: ____________ °Date: ____________ Movements: ____________ Start time: ____________ Finish time: ____________ °Date: ____________ Movements: ____________ Start time: ____________ Finish time: ____________  °Date: ____________ Movements: ____________ Start time: ____________ Finish time: ____________ °Date: ____________ Movements: ____________ Start time: ____________ Finish time: ____________ °Date: ____________ Movements: ____________ Start time: ____________ Finish time: ____________ °Date: ____________ Movements: ____________ Start time: ____________ Finish time: ____________ °Date: ____________ Movements: ____________ Start time: ____________ Finish time: ____________ °Date: ____________ Movements: ____________ Start time: ____________ Finish time: ____________ °Date: ____________ Movements: ____________ Start time: ____________ Finish time: ____________  °Date: ____________ Movements: ____________ Start time: ____________ Finish time: ____________ °Date: ____________ Movements: ____________ Start time: ____________ Finish  time: ____________ °Date: ____________ Movements: ____________ Start time: ____________ Finish time: ____________ °Date: ____________ Movements: ____________ Start time: ____________ Finish time: ____________ °Date: ____________ Movements: ____________ Start time: ____________ Finish time: ____________ °Date: ____________ Movements: ____________ Start time: ____________ Finish time: ____________ °Date: ____________ Movements: ____________ Start time: ____________ Finish time: ____________  °Date: ____________ Movements: ____________ Start time: ____________ Finish   time: ____________ °Date: ____________ Movements: ____________ Start time: ____________ Finish time: ____________ °Date: ____________ Movements: ____________ Start time: ____________ Finish time: ____________ °Date: ____________ Movements: ____________ Start time: ____________ Finish time: ____________ °Date: ____________ Movements: ____________ Start time: ____________ Finish time: ____________ °Date: ____________ Movements: ____________ Start time: ____________ Finish time: ____________ °Date: ____________ Movements: ____________ Start time: ____________ Finish time: ____________  °Date: ____________ Movements: ____________ Start time: ____________ Finish time: ____________ °Date: ____________ Movements: ____________ Start time: ____________ Finish time: ____________ °Date: ____________ Movements: ____________ Start time: ____________ Finish time: ____________ °Date: ____________ Movements: ____________ Start time: ____________ Finish time: ____________ °Date: ____________ Movements: ____________ Start time: ____________ Finish time: ____________ °Date: ____________ Movements: ____________ Start time: ____________ Finish time: ____________ °Date: ____________ Movements: ____________ Start time: ____________ Finish time: ____________  °Date: ____________ Movements: ____________ Start time: ____________ Finish time: ____________ °Date: ____________  Movements: ____________ Start time: ____________ Finish time: ____________ °Date: ____________ Movements: ____________ Start time: ____________ Finish time: ____________ °Date: ____________ Movements: ____________ Start time: ____________ Finish time: ____________ °Date: ____________ Movements: ____________ Start time: ____________ Finish time: ____________ °Date: ____________ Movements: ____________ Start time: ____________ Finish time: ____________ °Date: ____________ Movements: ____________ Start time: ____________ Finish time: ____________  °Date: ____________ Movements: ____________ Start time: ____________ Finish time: ____________ °Date: ____________ Movements: ____________ Start time: ____________ Finish time: ____________ °Date: ____________ Movements: ____________ Start time: ____________ Finish time: ____________ °Date: ____________ Movements: ____________ Start time: ____________ Finish time: ____________ °Date: ____________ Movements: ____________ Start time: ____________ Finish time: ____________ °Date: ____________ Movements: ____________ Start time: ____________ Finish time: ____________ °  °This information is not intended to replace advice given to you by your health care provider. Make sure you discuss any questions you have with your health care provider. °  °Document Released: 06/21/2006 Document Revised: 06/12/2014 Document Reviewed: 03/18/2012 °Elsevier Interactive Patient Education ©2016 [employer] °Braxton Hicks Contractions °Contractions of the uterus can occur throughout pregnancy. Contractions are not always a sign that you are in labor.  °WHAT ARE BRAXTON HICKS CONTRACTIONS?  °Contractions that occur before labor are called Braxton Hicks contractions, or false labor. Toward the end of pregnancy (32-34 weeks), these contractions can develop more often and may become more forceful. This is not true labor because these contractions do not result in opening (dilatation) and thinning of  the cervix. They are sometimes difficult to tell apart from true labor because these contractions can be forceful and people have different pain tolerances. You should not feel embarrassed if you go to the hospital with false labor. Sometimes, the only way to tell if you are in true labor is for your health care provider to look for changes in the cervix. °If there are no prenatal problems or other health problems associated with the pregnancy, it is completely safe to be sent home with false labor and await the onset of true labor. °HOW CAN YOU TELL THE DIFFERENCE BETWEEN TRUE AND FALSE LABOR? °False Labor °· The contractions of false labor are usually shorter and not as hard as those of true labor.   °· The contractions are usually irregular.   °· The contractions are often felt in the front of the lower abdomen and in the groin.   °· The contractions may go away when you walk around or change positions while lying down.   °· The contractions get weaker and are shorter lasting as time goes on.   °· The contractions do not usually become progressively stronger, regular, and closer together as with true labor.   °True Labor °· Contractions in true   labor last 30-70 seconds, become very regular, usually become more intense, and increase in frequency.   °· The contractions do not go away with walking.   °· The discomfort is usually felt in the top of the uterus and spreads to the lower abdomen and low back.   °· True labor can be determined by your health care provider with an exam. This will show that the cervix is dilating and getting thinner.   °WHAT TO REMEMBER °· Keep up with your usual exercises and follow other instructions given by your health care provider.   °· Take medicines as directed by your health care provider.   °· Keep your regular prenatal appointments.   °· Eat and drink lightly if you think you are going into labor.   °· If Braxton Hicks contractions are making you uncomfortable:   °¨ Change your  position from lying down or resting to walking, or from walking to resting.   °¨ Sit and rest in a tub of warm water.   °¨ Drink 2-3 glasses of water. Dehydration may cause these contractions.   °¨ Do slow and deep breathing several times an hour.   °WHEN SHOULD I SEEK IMMEDIATE MEDICAL CARE? °Seek immediate medical care if: °· Your contractions become stronger, more regular, and closer together.   °· You have fluid leaking or gushing from your vagina.   °· You have a fever.   °· You pass blood-tinged mucus.   °· You have vaginal bleeding.   °· You have continuous abdominal pain.   °· You have low back pain that you never had before.   °· You feel your baby's head pushing down and causing pelvic pressure.   °· Your baby is not moving as much as it used to.   °  °This information is not intended to replace advice given to you by your health care provider. Make sure you discuss any questions you have with your health care provider. °  °Document Released: 05/22/2005 Document Revised: 05/27/2013 Document Reviewed: 03/03/2013 °Elsevier Interactive Patient Education ©2016 [employer] Inc. ° °

## 2016-03-02 ENCOUNTER — Observation Stay (HOSPITAL_COMMUNITY)
Admission: AD | Admit: 2016-03-02 | Discharge: 2016-03-03 | Disposition: A | Discharge status: Home / Self Care | Payer: Medicaid Other | Source: Ambulatory Visit | Attending: Obstetrics and Gynecology | Admitting: Obstetrics and Gynecology

## 2016-03-02 ENCOUNTER — Encounter (HOSPITAL_COMMUNITY): Payer: Self-pay | Admitting: *Deleted

## 2016-03-02 DIAGNOSIS — Z3A37 37 weeks gestation of pregnancy: Secondary | ICD-10-CM | POA: Insufficient documentation

## 2016-03-02 DIAGNOSIS — O471 False labor at or after 37 completed weeks of gestation: Principal | ICD-10-CM

## 2016-03-02 MED ORDER — OXYCODONE-ACETAMINOPHEN 5-325 MG PO TABS
1.0000 | ORAL_TABLET | Freq: Once | ORAL | Status: DC | PRN
Start: 2016-03-02 — End: 2016-03-03

## 2016-03-02 NOTE — Progress Notes (Signed)
L Clemmons updated on Pt. Status. Plan recheck cevix in one hour. If no change, pt can be discharged home. Pt may have one Percocet upon discharge.

## 2016-03-02 NOTE — MAU Note (Signed)
Pt reports contractions all day long. Pt was seen in office and was 4/90%/+2 Per pt report.

## 2016-03-02 NOTE — Discharge Instructions (Signed)
Braxton Hicks Contractions °Contractions of the uterus can occur throughout pregnancy. Contractions are not always a sign that you are in labor.  °WHAT ARE BRAXTON HICKS CONTRACTIONS?  °Contractions that occur before labor are called Braxton Hicks contractions, or false labor. Toward the end of pregnancy (32-34 weeks), these contractions can develop more often and may become more forceful. This is not true labor because these contractions do not result in opening (dilatation) and thinning of the cervix. They are sometimes difficult to tell apart from true labor because these contractions can be forceful and people have different pain tolerances. You should not feel embarrassed if you go to the hospital with false labor. Sometimes, the only way to tell if you are in true labor is for your health care provider to look for changes in the cervix. °If there are no prenatal problems or other health problems associated with the pregnancy, it is completely safe to be sent home with false labor and await the onset of true labor. °HOW CAN YOU TELL THE DIFFERENCE BETWEEN TRUE AND FALSE LABOR? °False Labor °· The contractions of false labor are usually shorter and not as hard as those of true labor.   °· The contractions are usually irregular.   °· The contractions are often felt in the front of the lower abdomen and in the groin.   °· The contractions may go away when you walk around or change positions while lying down.   °· The contractions get weaker and are shorter lasting as time goes on.   °· The contractions do not usually become progressively stronger, regular, and closer together as with true labor.   °True Labor °· Contractions in true labor last 30-70 seconds, become very regular, usually become more intense, and increase in frequency.   °· The contractions do not go away with walking.   °· The discomfort is usually felt in the top of the uterus and spreads to the lower abdomen and low back.   °· True labor can be  determined by your health care provider with an exam. This will show that the cervix is dilating and getting thinner.   °WHAT TO REMEMBER °· Keep up with your usual exercises and follow other instructions given by your health care provider.   °· Take medicines as directed by your health care provider.   °· Keep your regular prenatal appointments.   °· Eat and drink lightly if you think you are going into labor.   °· If Braxton Hicks contractions are making you uncomfortable:   °¨ Change your position from lying down or resting to walking, or from walking to resting.   °¨ Sit and rest in a tub of warm water.   °¨ Drink 2-3 glasses of water. Dehydration may cause these contractions.   °¨ Do slow and deep breathing several times an hour.   °WHEN SHOULD I SEEK IMMEDIATE MEDICAL CARE? °Seek immediate medical care if: °· Your contractions become stronger, more regular, and closer together.   °· You have fluid leaking or gushing from your vagina.   °· You have a fever.   °· You pass blood-tinged mucus.   °· You have vaginal bleeding.   °· You have continuous abdominal pain.   °· You have low back pain that you never had before.   °· You feel your baby's head pushing down and causing pelvic pressure.   °· Your baby is not moving as much as it used to.   °  °This information is not intended to replace advice given to you by your health care provider. Make sure you discuss any questions you have with your health care   provider. °  °Document Released: 05/22/2005 Document Revised: 05/27/2013 Document Reviewed: 03/03/2013 °Elsevier Interactive Patient Education ©2016 [employer] ° °

## 2016-03-02 NOTE — MAU Note (Signed)
Urine sent to lab 

## 2016-03-02 NOTE — Progress Notes (Signed)
Clemmons CNM updated on pt status. Pt to be rechecked in one hour.

## 2016-03-03 ENCOUNTER — Encounter (HOSPITAL_COMMUNITY): Payer: Self-pay | Admitting: *Deleted

## 2016-03-03 DIAGNOSIS — O471 False labor at or after 37 completed weeks of gestation: Secondary | ICD-10-CM | POA: Diagnosis present

## 2016-03-03 DIAGNOSIS — Z3A37 37 weeks gestation of pregnancy: Secondary | ICD-10-CM | POA: Diagnosis not present

## 2016-03-03 LAB — TYPE AND SCREEN
ABO/RH(D): B POS
Antibody Screen: NEGATIVE

## 2016-03-03 LAB — CBC
HEMATOCRIT: 30.9 % — AB (ref 36.0–46.0)
HEMOGLOBIN: 10.7 g/dL — AB (ref 12.0–15.0)
MCH: 30.7 pg (ref 26.0–34.0)
MCHC: 34.6 g/dL (ref 30.0–36.0)
MCV: 88.5 fL (ref 78.0–100.0)
Platelets: 198 10*3/uL (ref 150–400)
RBC: 3.49 MIL/uL — AB (ref 3.87–5.11)
RDW: 13.1 % (ref 11.5–15.5)
WBC: 7.3 10*3/uL (ref 4.0–10.5)

## 2016-03-03 LAB — RPR: RPR: NONREACTIVE

## 2016-03-03 MED ORDER — OXYTOCIN BOLUS FROM INFUSION: 500.0000 mL | Freq: Once | INTRAVENOUS | Status: DC

## 2016-03-03 MED ORDER — OXYTOCIN 40 UNITS IN LACTATED RINGERS INFUSION - SIMPLE MED: 2.5000 [IU]/h | INTRAVENOUS | Status: DC

## 2016-03-03 MED ORDER — LACTATED RINGERS IV SOLN: 500.0000 mL | INTRAVENOUS | Status: DC | PRN

## 2016-03-03 MED ORDER — OXYCODONE-ACETAMINOPHEN 5-325 MG PO TABS
1.0000 | ORAL_TABLET | ORAL | Status: DC | PRN
  Administered 2016-03-03: 2 via ORAL
  Filled 2016-03-03: qty 2

## 2016-03-03 MED ORDER — LIDOCAINE HCL (PF) 1 % IJ SOLN: 30.0000 mL | INTRAMUSCULAR | Status: DC | PRN

## 2016-03-03 MED ORDER — FLEET ENEMA 7-19 GM/118ML RE ENEM: 1.0000 | ENEMA | RECTAL | Status: DC | PRN

## 2016-03-03 MED ORDER — SOD CITRATE-CITRIC ACID 500-334 MG/5ML PO SOLN: 30.0000 mL | ORAL | Status: DC | PRN

## 2016-03-03 MED ORDER — FENTANYL CITRATE (PF) 100 MCG/2ML IJ SOLN: 100.0000 ug | INTRAMUSCULAR | Status: DC | PRN

## 2016-03-03 MED ORDER — LACTATED RINGERS IV SOLN: INTRAVENOUS | Status: DC

## 2016-03-03 NOTE — Progress Notes (Signed)
Per L.Clemmons, may hold starting PIV/labs until patient admitted to INP from OBV status.  Received orders for PO PRN pain meds. Also received verbal to only check cervix if any changes to patient or baby status. Will continue to monitor.

## 2016-03-03 NOTE — Progress Notes (Signed)
POC to D/C home.  Answered questions for pt.  Keep appt with office on Thursday.

## 2016-03-03 NOTE — H&P (Signed)
Joanne Baker 40 y.o.5246w3d presents to MAU with contractions. RN in MAU states she feels like her cervix has changed from 4 to 5 cm. Will admit for observation overnight.  O: BP 115/69 (BP Location: Left Arm)   Pulse 83   Temp 98 F (36.7 C) (Oral)   Resp 16   Ht 5\' 7"  (1.702 m)   Wt 188 lb (85.3 kg)   LMP 06/17/2015   SpO2 99%   BMI 29.44 kg/m   Cervix 5/80/-1 Contractions q 2-3 FHT Category 1  A: Braxton Hicks contractions IUP @ 37+3 Previous C/S Patient Active Problem List   Diagnosis Date Noted  . Indication for care or intervention in labor or delivery 03/03/2016  . Third trimester bleeding, antepartum 02/26/2016  . Left knee pain 02/11/2014  . Finger pain, right 02/11/2014  . 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 Observe overnight for active labor      Discharge home in AM if no labor.

## 2016-03-03 NOTE — Progress Notes (Signed)
Reviewed D/C instructions with pt and mother.  Answered questions.  Copy of instructions given to pt.  Keep appt with [facility] on Thursday.

## 2016-03-03 NOTE — Progress Notes (Signed)
Hospital day # 0 pregnancy at 22100w3d--IUP for contractions.  S:  Comfortable, contractions have spaced out.  Wants to be delivered      Perception of contractions: none, irregular, every 10 minutes      Vaginal bleeding: none now and brown       Vaginal discharge:  no significant change  O: BP 129/81 (BP Location: Left Arm)   Pulse 74   Temp 98 F (36.7 C) (Oral)   Resp 16   Ht 5\' 7"  (1.702 m)   Wt 85.3 kg (188 lb)   LMP 06/17/2015   SpO2 99%   BMI 29.44 kg/m       Fetal tracings:120 BL with accels to 150, no decels      Contractions:   1 in 10 minutes      Uterus gravid and non-tender      Extremities: no significant edema and no signs of DVT          Labs:  None       Meds: N/A  A: 61100w3d with False labor     stable  P: Discussed with patient that she was not in labor. Discussed unable to augment at 37 weeks.  Discussed when to come back or signs of labor.  F/U in office on Thursday.       Kenney HousemanNancy Jean Tanielle Emigh CNM, MSN 03/03/2016 8:11 AM

## 2016-03-03 NOTE — Discharge Summary (Signed)
Physician Discharge Summary  Patient ID: JAMALA KOHEN MRN: [id number] DOB/AGE: Jun 02, 1976 40 y.o.  Admit date: 03/02/2016 Discharge date: 03/03/2016  Admission Diagnoses: Rule out labor  Discharge Diagnoses:  Active Problems:   Indication for care or intervention in labor or delivery False labor   Discharged Condition: stable  Hospital Course: Pt is a 40 yo [id number] presents with contractions at 37.5 IUP.  Pt prenatal hx unremarkable.  Pt was observed over night and contractions spaced out. SVE with no change noted.  Pt discharged home with follow up in office on Thursday.    Consults: None  Significant Diagnostic Studies: None  Treatments: procedures: Nonstress test  Discharge Exam: Blood pressure 115/69, pulse 83, temperature 98 F (36.7 C), temperature source Oral, resp. rate 16, height 5\' 7"  (1.702 m), weight 85.3 kg (188 lb), last menstrual period 06/17/2015, SpO2 99 %. Alert and Oriented Respirations normal Abd soft gravid non tender FHT 120 BL accels noted, no decels, irreg contractions SVE no change  Disposition: 01-Home or Self Care  Discharge Instructions    Discharge activity:  No Restrictions    Complete by:  As directed    Discharge diet:  No restrictions    Complete by:  As directed    Fetal Kick Count:  Lie on our left side for one hour after a meal, and count the number of times your baby kicks.  If it is less than 5 times, get up, move around and drink some juice.  Repeat the test 30 minutes later.  If it is still less than 5 kicks in an hour, notify your doctor.    Complete by:  As directed    LABOR:  When conractions begin, you should start to time them from the beginning of one contraction to the beginning  of the next.  When contractions are 5 - 10 minutes apart or less and have been regular for at least an hour, you should call your health care provider.    Complete by:  As directed    No sexual activity restrictions    Complete by:  As directed    Notify physician for chills or fever    Complete by:  As directed    Notify physician for sudden gushing of fluid from the vagina (with or without continued leaking)    Complete by:  As directed    Notify physician for sudden, constant, or occasional abdominal pain    Complete by:  As directed    Notify physician if baby moving less than usual    Complete by:  As directed        Medication List    TAKE these medications   albuterol 108 (90 Base) MCG/ACT inhaler Commonly known as:  PROVENTIL HFA;VENTOLIN HFA Inhale 1-2 puffs into the lungs every 6 (six) hours as needed for wheezing or shortness of breath.   diphenhydrAMINE 25 MG tablet Commonly known as:  BENADRYL Take 25 mg by mouth at bedtime as needed for sleep.   montelukast 10 MG tablet Commonly known as:  SINGULAIR Take 10 mg by mouth daily.   pantoprazole 40 MG tablet Commonly known as:  PROTONIX Take 40 mg by mouth daily.   prenatal multivitamin Tabs tablet Take 1 tablet by mouth daily.   Vitamin D 2000 units Caps Take 4,000 Units by mouth daily.      Follow-up Information    [facility] Follow up in 3 day(s).   Specialty:  Obstetrics and Gynecology  Contact information: [address] [place] [id number] [phone]          Signed: Kenney Housemanancy Jean Prothero 03/03/2016, 9:49 AM

## 2016-03-05 ENCOUNTER — Encounter (HOSPITAL_COMMUNITY): Payer: Self-pay | Admitting: Certified Nurse Midwife

## 2016-03-05 ENCOUNTER — Inpatient Hospital Stay (HOSPITAL_COMMUNITY)
Admission: AD | Admit: 2016-03-05 | Discharge: 2016-03-08 | DRG: 767 | Disposition: A | Discharge status: Home / Self Care | Payer: Medicaid Other | Source: Ambulatory Visit | Attending: Obstetrics & Gynecology | Admitting: Obstetrics & Gynecology

## 2016-03-05 DIAGNOSIS — F329 Major depressive disorder, single episode, unspecified: Secondary | ICD-10-CM | POA: Diagnosis present

## 2016-03-05 DIAGNOSIS — O9952 Diseases of the respiratory system complicating childbirth: Secondary | ICD-10-CM | POA: Diagnosis present

## 2016-03-05 DIAGNOSIS — F32A Depression, unspecified: Secondary | ICD-10-CM

## 2016-03-05 DIAGNOSIS — A630 Anogenital (venereal) warts: Secondary | ICD-10-CM | POA: Diagnosis present

## 2016-03-05 DIAGNOSIS — O9832 Other infections with a predominantly sexual mode of transmission complicating childbirth: Secondary | ICD-10-CM | POA: Diagnosis present

## 2016-03-05 DIAGNOSIS — E559 Vitamin D deficiency, unspecified: Secondary | ICD-10-CM | POA: Diagnosis present

## 2016-03-05 DIAGNOSIS — O9081 Anemia of the puerperium: Secondary | ICD-10-CM | POA: Diagnosis not present

## 2016-03-05 DIAGNOSIS — Z8249 Family history of ischemic heart disease and other diseases of the circulatory system: Secondary | ICD-10-CM | POA: Diagnosis not present

## 2016-03-05 DIAGNOSIS — J45909 Unspecified asthma, uncomplicated: Secondary | ICD-10-CM | POA: Diagnosis present

## 2016-03-05 DIAGNOSIS — O09529 Supervision of elderly multigravida, unspecified trimester: Secondary | ICD-10-CM

## 2016-03-05 DIAGNOSIS — O285 Abnormal chromosomal and genetic finding on antenatal screening of mother: Secondary | ICD-10-CM

## 2016-03-05 DIAGNOSIS — D649 Anemia, unspecified: Secondary | ICD-10-CM | POA: Diagnosis not present

## 2016-03-05 DIAGNOSIS — O4202 Full-term premature rupture of membranes, onset of labor within 24 hours of rupture: Secondary | ICD-10-CM | POA: Diagnosis present

## 2016-03-05 DIAGNOSIS — O34219 Maternal care for unspecified type scar from previous cesarean delivery: Secondary | ICD-10-CM | POA: Diagnosis present

## 2016-03-05 DIAGNOSIS — Z3A37 37 weeks gestation of pregnancy: Secondary | ICD-10-CM | POA: Diagnosis not present

## 2016-03-05 DIAGNOSIS — Z833 Family history of diabetes mellitus: Secondary | ICD-10-CM

## 2016-03-05 DIAGNOSIS — O99344 Other mental disorders complicating childbirth: Secondary | ICD-10-CM | POA: Diagnosis present

## 2016-03-05 DIAGNOSIS — Z8279 Family history of other congenital malformations, deformations and chromosomal abnormalities: Secondary | ICD-10-CM

## 2016-03-05 DIAGNOSIS — O34211 Maternal care for low transverse scar from previous cesarean delivery: Secondary | ICD-10-CM | POA: Diagnosis present

## 2016-03-05 DIAGNOSIS — O4693 Antepartum hemorrhage, unspecified, third trimester: Secondary | ICD-10-CM | POA: Diagnosis present

## 2016-03-05 HISTORY — DX: Anogenital (venereal) warts: A63.0

## 2016-03-05 HISTORY — DX: Anemia, unspecified: D64.9

## 2016-03-05 LAB — CBC
HEMATOCRIT: 32.8 % — AB (ref 36.0–46.0)
HEMOGLOBIN: 11.5 g/dL — AB (ref 12.0–15.0)
MCH: 30.9 pg (ref 26.0–34.0)
MCHC: 35.1 g/dL (ref 30.0–36.0)
MCV: 88.2 fL (ref 78.0–100.0)
Platelets: 233 10*3/uL (ref 150–400)
RBC: 3.72 MIL/uL — ABNORMAL LOW (ref 3.87–5.11)
RDW: 13 % (ref 11.5–15.5)
WBC: 8.8 10*3/uL (ref 4.0–10.5)

## 2016-03-05 MED ORDER — DIPHENHYDRAMINE HCL 50 MG/ML IJ SOLN: 12.5000 mg | INTRAMUSCULAR | Status: DC | PRN

## 2016-03-05 MED ORDER — OXYTOCIN 40 UNITS IN LACTATED RINGERS INFUSION - SIMPLE MED: 1.0000 m[IU]/min | INTRAVENOUS | Status: DC

## 2016-03-05 MED ORDER — LACTATED RINGERS IV SOLN: 500.0000 mL | INTRAVENOUS | Status: DC | PRN

## 2016-03-05 MED ORDER — LACTATED RINGERS IV SOLN: 500.0000 mL | Freq: Once | INTRAVENOUS | Status: DC

## 2016-03-05 MED ORDER — TERBUTALINE SULFATE 1 MG/ML IJ SOLN: 0.2500 mg | Freq: Once | INTRAMUSCULAR | Status: DC | PRN

## 2016-03-05 MED ORDER — OXYTOCIN 40 UNITS IN LACTATED RINGERS INFUSION - SIMPLE MED
1.0000 m[IU]/min | INTRAVENOUS | Status: DC
  Administered 2016-03-05: 1 m[IU]/min via INTRAVENOUS
  Filled 2016-03-05: qty 1000

## 2016-03-05 MED ORDER — FENTANYL CITRATE (PF) 100 MCG/2ML IJ SOLN
50.0000 ug | INTRAMUSCULAR | Status: DC | PRN
  Administered 2016-03-06 (×2): 100 ug via INTRAVENOUS
  Filled 2016-03-05 (×3): qty 2

## 2016-03-05 MED ORDER — OXYCODONE-ACETAMINOPHEN 5-325 MG PO TABS: 2.0000 | ORAL_TABLET | ORAL | Status: DC | PRN

## 2016-03-05 MED ORDER — ACETAMINOPHEN 325 MG PO TABS: 650.0000 mg | ORAL_TABLET | ORAL | Status: DC | PRN

## 2016-03-05 MED ORDER — LACTATED RINGERS IV SOLN
INTRAVENOUS | Status: DC
  Administered 2016-03-05: 23:00:00 via INTRAVENOUS

## 2016-03-05 MED ORDER — OXYTOCIN BOLUS FROM INFUSION: 500.0000 mL | Freq: Once | INTRAVENOUS | Status: DC

## 2016-03-05 MED ORDER — ONDANSETRON HCL 4 MG/2ML IJ SOLN: 4.0000 mg | Freq: Four times a day (QID) | INTRAMUSCULAR | Status: DC | PRN

## 2016-03-05 MED ORDER — SOD CITRATE-CITRIC ACID 500-334 MG/5ML PO SOLN: 30.0000 mL | ORAL | Status: DC | PRN

## 2016-03-05 MED ORDER — FENTANYL 2.5 MCG/ML BUPIVACAINE 1/10 % EPIDURAL INFUSION (WH - ANES)
14.0000 mL/h | INTRAMUSCULAR | Status: DC | PRN
  Filled 2016-03-05: qty 125

## 2016-03-05 MED ORDER — SOD CITRATE-CITRIC ACID 500-334 MG/5ML PO SOLN
30.0000 mL | ORAL | Status: DC | PRN
  Administered 2016-03-05 – 2016-03-06 (×2): 30 mL via ORAL
  Filled 2016-03-05 (×2): qty 15

## 2016-03-05 MED ORDER — LIDOCAINE HCL (PF) 1 % IJ SOLN: 30.0000 mL | INTRAMUSCULAR | Status: DC | PRN

## 2016-03-05 MED ORDER — PHENYLEPHRINE 40 MCG/ML (10ML) SYRINGE FOR IV PUSH (FOR BLOOD PRESSURE SUPPORT): 80.0000 ug | PREFILLED_SYRINGE | INTRAVENOUS | Status: DC | PRN

## 2016-03-05 MED ORDER — OXYCODONE-ACETAMINOPHEN 5-325 MG PO TABS: 1.0000 | ORAL_TABLET | ORAL | Status: DC | PRN

## 2016-03-05 MED ORDER — OXYTOCIN 40 UNITS IN LACTATED RINGERS INFUSION - SIMPLE MED: 2.5000 [IU]/h | INTRAVENOUS | Status: DC

## 2016-03-05 MED ORDER — EPHEDRINE 5 MG/ML INJ: 10.0000 mg | INTRAVENOUS | Status: DC | PRN

## 2016-03-05 MED ORDER — FLEET ENEMA 7-19 GM/118ML RE ENEM: 1.0000 | ENEMA | Freq: Every day | RECTAL | Status: DC | PRN

## 2016-03-05 MED ORDER — OXYTOCIN BOLUS FROM INFUSION
500.0000 mL | Freq: Once | INTRAVENOUS | Status: AC
  Administered 2016-03-06: 500 mL via INTRAVENOUS

## 2016-03-05 MED ORDER — OXYTOCIN 40 UNITS IN LACTATED RINGERS INFUSION - SIMPLE MED
2.5000 [IU]/h | INTRAVENOUS | Status: DC
Start: 2016-03-05 — End: 2016-03-06

## 2016-03-05 NOTE — MAU Note (Signed)
Urine in lab 

## 2016-03-05 NOTE — H&P (Signed)
Joanne BonierBrandi A Baker is a 40 y.o. female presenting for SROM clear at 11:30 am. OB History    Gravida Para Term Preterm AB Living   7 3 2 1 3 3    SAB TAB Ectopic Multiple Live Births   1 2     3      Past Medical History:  Diagnosis Date  . Anemia   . Anxiety 2014  . Asthma   . Depression 2014  . Genital warts    Past Surgical History:  Procedure Laterality Date  . CESAREAN SECTION    . KNEE SURGERY Left    Family History: family history includes Arthritis in her mother; Cancer in her father and sister; Diabetes in her father; Heart disease in her father; Hypothyroidism in her mother and sister. Social History:  reports that she has never smoked. She has never used smokeless tobacco. She reports that she does not drink alcohol or use drugs.     Maternal Diabetes: No Genetic Screening: Abnormal:  Results: Elevated AFP Maternal Ultrasounds/Referrals: Normal Fetal Ultrasounds or other Referrals:  Fetal echo Maternal Substance Abuse:  No Significant Maternal Medications:  None Significant Maternal Lab Results:  None Other Comments:  None  ROS History Dilation: 6 Effacement (%): 80 Station: -2 Exam by:: AYetta Barre. Jones RNC Blood pressure 113/74, pulse 85, temperature 98.6 F (37 C), temperature source Oral, resp. rate 20, height 5\' 7"  (1.702 m), weight 187 lb (84.8 kg), last menstrual period 06/17/2015, SpO2 100 %. Exam Physical Exam  Physical Examination: General appearance - alert, well appearing, and in no distress Chest - clear to auscultation, no wheezes, rales or rhonchi, symmetric air entry Heart - normal rate and regular rhythm Abdomen - soft, nontender, nondistended, no masses or organomegaly Extremities - Homan's sign negative bilaterally  Prenatal labs: ABO, Rh: --/--/B POS (10/01 1300) Antibody: PENDING (10/01 1300) Rubella: Immune (03/02 0000) RPR: Non Reactive (09/29 0404)  HBsAg: Negative (03/02 0000)  HIV: Non-reactive (03/02 0000)  GBS: Negative (09/23 0000)    Assessment/Plan: Term with SROM Cat 1 GBS neg H/o infant with transposition of the great vessels.  Echo was normal with this pregnancy elvated AFP.  Panorama and growth US WNL H/O SVD times 2 then CS for previa.  [facility].  R&B reviewed Pt wants observation for now.  She has contraction q 2-4 minutes but they are mild.  She declined any augmentation Does not want anything for pain currently.  Plans only IV pain meds   Jayme Mednick A 03/05/2016, 1:40 PM

## 2016-03-05 NOTE — Anesthesia Pain Management Evaluation Note (Signed)
  CRNA Pain Management Visit Note  Patient: Joanne BonierBrandi A Cervantes, 40 y.o., female  "Hello I am a member of the anesthesia team at [facility]. We have an anesthesia team available at all times to provide care throughout the hospital, including epidural management and anesthesia for C-section. I don't know your plan for the delivery whether it a natural birth, water birth, IV sedation, nitrous supplementation, doula or epidural, but we want to meet your pain goals."   1.Was your pain managed to your expectations on prior hospitalizations?   Yes   2.What is your expectation for pain management during this hospitalization?     Labor support without medications  3.How can we help you reach that goal? unsure  Record the patient's initial score and the patient's pain goal.   Pain: 0  Pain Goal: 8 [facility] wants you to be able to say your pain was always managed very well.  Cephus ShellingBURGER,Latrice Storlie 03/05/2016

## 2016-03-05 NOTE — Progress Notes (Signed)
  Subjective: In bed -- experiencing mild ctxs. Considering Nitrous for pain control.  Objective: BP 127/77   Pulse 76   Temp 98.4 F (36.9 C) (Oral)   Resp 16   Ht 5\' 7"  (1.702 m)   Wt 84.8 kg (187 lb)   LMP 06/17/2015   SpO2 100%   BMI 29.29 kg/m   Today's Vitals   03/05/16 1854 03/05/16 2032 03/05/16 2225 03/05/16 2228  BP: 126/69 110/62  127/77  Pulse: 80 92  76  Resp:  16  16  Temp:  98.6 F (37 C)  98.4 F (36.9 C)  TempSrc:  Oral  Oral  SpO2:      Weight:      Height:      PainSc:   0-No pain    FHT: BL 135 w/ moderate variability, +accels, earlys, no lates UC:   irregular, every 5-7 minutes SVE:   Dilation: 6.5 Effacement (%): 80 Station: -2 Exam by:: K.Chanc Kervin, CNM @ 2225   Assessment:  IUP at 37.5 wks ROM x 11 1/2 hrs; no s/s of infection No cervical change since admission Cat 1 FHRT GBS neg  Plan: Risks and benefits of augmentation were reviewed, including failure of method, prolonged labor, need for further intervention, and risk of cesarean. Pt verbalized understanding of these risks and wishes to proceed w/ augmentation.  Sherre ScarletWILLIAMS, Birgitta Uhlir CNM 03/05/2016, 10:35 PM

## 2016-03-05 NOTE — Progress Notes (Addendum)
Assuming care of Joanne Baker, 40 yo [id number] @ 37.5 wks admitted for PROM, clear fluid.  Subjective: Having ctxs, but not painful. Has been ambulating and standing/rocking in room. Desires NCB. +FM. Continues to leak clear fluid. No VB.  Objective: BP 110/62   Pulse 92   Temp 98.6 F (37 C) (Oral)   Resp 16   Ht 5\' 7"  (1.702 m)   Wt 84.8 kg (187 lb)   LMP 06/17/2015   SpO2 100%   BMI 29.29 kg/m  No intake/output data recorded. No intake/output data recorded.  Today's Vitals   03/05/16 1659 03/05/16 1853 03/05/16 1854 03/05/16 2032  BP: 112/68  126/69 110/62  Pulse: 93  80 92  Resp: 20   16  Temp: 98.5 F (36.9 C) 98.9 F (37.2 C)  98.6 F (37 C)  TempSrc: Oral Oral  Oral  SpO2:      Weight:      Height:      PainSc: 3        FHT: BL 145 w/ moderate variability, +accels, no decels UC:   irregular, every 5-7 minutes SVE:   Dilation: 6.5 Effacement (%): 80 Station: -2 Exam by:: Joanne Baker, RNC @ 1659   Assessment:  IUP at 37.5 wks PROM x 9 1/2 hrs; no s/s of infection Active phase of labor w/o cervical change Cat 1 FHRT Desires VBAC GBS neg AMA Predicted chance of vaginal birth after cesarean: 66.2%    Plan: Pt in agreement w/ checking cvx around 22:30 and beginning low dose Pitocin if no change. Expect progress and SVD.   Joanne Baker, Joanne Baker CNM 03/05/2016, 9:03 PM

## 2016-03-05 NOTE — MAU Note (Addendum)
Pt states her water broke at 1130. Clear fluid. Pt feels occasional ctxs. Pt denies LOF. Fetus is active.

## 2016-03-06 ENCOUNTER — Encounter (HOSPITAL_COMMUNITY): Payer: Self-pay | Admitting: *Deleted

## 2016-03-06 ENCOUNTER — Inpatient Hospital Stay (HOSPITAL_COMMUNITY): Payer: Medicaid Other | Admitting: Anesthesiology

## 2016-03-06 ENCOUNTER — Encounter (HOSPITAL_COMMUNITY)
Admission: AD | Disposition: A | Discharge status: Home / Self Care | Payer: Self-pay | Source: Ambulatory Visit | Attending: Obstetrics & Gynecology

## 2016-03-06 HISTORY — PX: DILATION AND EVACUATION: SHX1459

## 2016-03-06 HISTORY — PX: PERINEAL LACERATION REPAIR: SHX5389

## 2016-03-06 LAB — CBC WITH DIFFERENTIAL/PLATELET
Basophils Absolute: 0 10*3/uL (ref 0.0–0.1)
Basophils Relative: 0 %
EOS ABS: 0 10*3/uL (ref 0.0–0.7)
EOS PCT: 0 %
HCT: 19.5 % — ABNORMAL LOW (ref 36.0–46.0)
Hemoglobin: 7 g/dL — ABNORMAL LOW (ref 12.0–15.0)
LYMPHS ABS: 1.4 10*3/uL (ref 0.7–4.0)
Lymphocytes Relative: 8 %
MCH: 32 pg (ref 26.0–34.0)
MCHC: 35.9 g/dL (ref 30.0–36.0)
MCV: 89 fL (ref 78.0–100.0)
MONOS PCT: 4 %
Monocytes Absolute: 0.7 10*3/uL (ref 0.1–1.0)
Neutro Abs: 15.1 10*3/uL — ABNORMAL HIGH (ref 1.7–7.7)
Neutrophils Relative %: 88 %
PLATELETS: 151 10*3/uL (ref 150–400)
RBC: 2.19 MIL/uL — AB (ref 3.87–5.11)
RDW: 13 % (ref 11.5–15.5)
WBC: 17.2 10*3/uL — AB (ref 4.0–10.5)

## 2016-03-06 LAB — CBC
HCT: 22.7 % — ABNORMAL LOW (ref 36.0–46.0)
Hemoglobin: 8.1 g/dL — ABNORMAL LOW (ref 12.0–15.0)
MCH: 31.9 pg (ref 26.0–34.0)
MCHC: 35.7 g/dL (ref 30.0–36.0)
MCV: 89.4 fL (ref 78.0–100.0)
PLATELETS: 172 10*3/uL (ref 150–400)
RBC: 2.54 MIL/uL — ABNORMAL LOW (ref 3.87–5.11)
RDW: 13 % (ref 11.5–15.5)
WBC: 10.4 10*3/uL (ref 4.0–10.5)

## 2016-03-06 LAB — RPR: RPR Ser Ql: NONREACTIVE

## 2016-03-06 LAB — PREPARE RBC (CROSSMATCH)

## 2016-03-06 SURGERY — DILATION AND EVACUATION, UTERUS
Anesthesia: Monitor Anesthesia Care | Site: Vagina | Wound class: Clean Contaminated

## 2016-03-06 MED ORDER — MIDAZOLAM HCL 2 MG/2ML IJ SOLN
INTRAMUSCULAR | Status: DC | PRN
  Administered 2016-03-06: 2 mg via INTRAVENOUS

## 2016-03-06 MED ORDER — OXYTOCIN 40 UNITS IN LACTATED RINGERS INFUSION - SIMPLE MED: 2.5000 [IU]/h | INTRAVENOUS | Status: DC | PRN

## 2016-03-06 MED ORDER — FENTANYL CITRATE (PF) 100 MCG/2ML IJ SOLN: 25.0000 ug | INTRAMUSCULAR | Status: DC | PRN

## 2016-03-06 MED ORDER — IBUPROFEN 600 MG PO TABS
600.0000 mg | ORAL_TABLET | Freq: Four times a day (QID) | ORAL | Status: DC
  Administered 2016-03-07 – 2016-03-08 (×5): 600 mg via ORAL
  Filled 2016-03-06 (×5): qty 1

## 2016-03-06 MED ORDER — FENTANYL CITRATE (PF) 100 MCG/2ML IJ SOLN
INTRAMUSCULAR | Status: DC | PRN
  Administered 2016-03-06: 100 ug via INTRAVENOUS

## 2016-03-06 MED ORDER — FENTANYL CITRATE (PF) 100 MCG/2ML IJ SOLN
INTRAMUSCULAR | Status: AC
  Filled 2016-03-06: qty 2

## 2016-03-06 MED ORDER — ACETAMINOPHEN 325 MG PO TABS
650.0000 mg | ORAL_TABLET | Freq: Once | ORAL | Status: AC
Start: 2016-03-06 — End: 2016-03-06
  Administered 2016-03-06: 650 mg via ORAL
  Filled 2016-03-06: qty 2

## 2016-03-06 MED ORDER — PHENYLEPHRINE 40 MCG/ML (10ML) SYRINGE FOR IV PUSH (FOR BLOOD PRESSURE SUPPORT): 80.0000 ug | PREFILLED_SYRINGE | INTRAVENOUS | Status: DC | PRN

## 2016-03-06 MED ORDER — OXYTOCIN 10 UNIT/ML IJ SOLN
INTRAMUSCULAR | Status: AC
  Filled 2016-03-06: qty 4

## 2016-03-06 MED ORDER — DIPHENHYDRAMINE HCL 50 MG/ML IJ SOLN: 12.5000 mg | INTRAMUSCULAR | Status: DC | PRN

## 2016-03-06 MED ORDER — ONDANSETRON HCL 4 MG PO TABS: 4.0000 mg | ORAL_TABLET | ORAL | Status: DC | PRN

## 2016-03-06 MED ORDER — ACETAMINOPHEN 325 MG PO TABS
650.0000 mg | ORAL_TABLET | ORAL | Status: DC | PRN
  Administered 2016-03-06 – 2016-03-07 (×4): 650 mg via ORAL
  Filled 2016-03-06 (×4): qty 2

## 2016-03-06 MED ORDER — PROPOFOL 500 MG/50ML IV EMUL
INTRAVENOUS | Status: DC | PRN
  Administered 2016-03-06 (×8): 10 mg via INTRAVENOUS

## 2016-03-06 MED ORDER — EPHEDRINE 5 MG/ML INJ: 10.0000 mg | INTRAVENOUS | Status: DC | PRN

## 2016-03-06 MED ORDER — ONDANSETRON HCL 4 MG/2ML IJ SOLN: 4.0000 mg | Freq: Once | INTRAMUSCULAR | Status: DC | PRN

## 2016-03-06 MED ORDER — BENZOCAINE-MENTHOL 20-0.5 % EX AERO
1.0000 "application " | INHALATION_SPRAY | CUTANEOUS | Status: DC | PRN
  Administered 2016-03-06: 1 via TOPICAL
  Filled 2016-03-06: qty 56

## 2016-03-06 MED ORDER — ZOLPIDEM TARTRATE 5 MG PO TABS: 5.0000 mg | ORAL_TABLET | Freq: Every evening | ORAL | Status: DC | PRN

## 2016-03-06 MED ORDER — SODIUM CHLORIDE 0.9 % IV SOLN: Freq: Once | INTRAVENOUS | Status: DC

## 2016-03-06 MED ORDER — DIPHENHYDRAMINE HCL 50 MG/ML IJ SOLN
25.0000 mg | Freq: Once | INTRAMUSCULAR | Status: AC
  Administered 2016-03-06: 25 mg via INTRAVENOUS
  Filled 2016-03-06: qty 1

## 2016-03-06 MED ORDER — STERILE WATER FOR IRRIGATION IR SOLN
Status: DC | PRN
  Administered 2016-03-06: 1000 mL

## 2016-03-06 MED ORDER — SENNOSIDES-DOCUSATE SODIUM 8.6-50 MG PO TABS
2.0000 | ORAL_TABLET | ORAL | Status: DC
Start: 2016-03-07 — End: 2016-03-08
  Administered 2016-03-07 (×2): 2 via ORAL
  Filled 2016-03-06 (×2): qty 2

## 2016-03-06 MED ORDER — PRENATAL MULTIVITAMIN CH
1.0000 | ORAL_TABLET | Freq: Every day | ORAL | Status: DC
  Administered 2016-03-07 – 2016-03-08 (×2): 1 via ORAL
  Filled 2016-03-06 (×2): qty 1

## 2016-03-06 MED ORDER — METOCLOPRAMIDE HCL 5 MG/ML IJ SOLN
INTRAMUSCULAR | Status: AC
  Filled 2016-03-06: qty 2

## 2016-03-06 MED ORDER — LACTATED RINGERS IV SOLN: INTRAVENOUS | Status: DC

## 2016-03-06 MED ORDER — DIPHENHYDRAMINE HCL 25 MG PO CAPS: 25.0000 mg | ORAL_CAPSULE | Freq: Four times a day (QID) | ORAL | Status: DC | PRN

## 2016-03-06 MED ORDER — ALBUMIN HUMAN 25 % IV SOLN: 12.5000 g | Freq: Once | INTRAVENOUS | Status: DC

## 2016-03-06 MED ORDER — SODIUM CHLORIDE 0.9 % IV SOLN
Freq: Once | INTRAVENOUS | Status: AC
  Administered 2016-03-06: 15:00:00 via INTRAVENOUS

## 2016-03-06 MED ORDER — CEFAZOLIN SODIUM-DEXTROSE 2-3 GM-% IV SOLR
INTRAVENOUS | Status: DC | PRN
  Administered 2016-03-06: 2 g via INTRAVENOUS

## 2016-03-06 MED ORDER — LACTATED RINGERS IV SOLN
INTRAVENOUS | Status: DC | PRN
  Administered 2016-03-06 (×2): via INTRAVENOUS

## 2016-03-06 MED ORDER — WITCH HAZEL-GLYCERIN EX PADS: 1.0000 "application " | MEDICATED_PAD | CUTANEOUS | Status: DC | PRN

## 2016-03-06 MED ORDER — COCONUT OIL OIL: 1.0000 "application " | TOPICAL_OIL | Status: DC | PRN

## 2016-03-06 MED ORDER — FERROUS SULFATE 325 (65 FE) MG PO TABS
325.0000 mg | ORAL_TABLET | Freq: Two times a day (BID) | ORAL | Status: DC
  Administered 2016-03-06 – 2016-03-08 (×5): 325 mg via ORAL
  Filled 2016-03-06 (×5): qty 1

## 2016-03-06 MED ORDER — LACTATED RINGERS IV SOLN: 500.0000 mL | Freq: Once | INTRAVENOUS | Status: DC

## 2016-03-06 MED ORDER — CEFAZOLIN SODIUM-DEXTROSE 2-4 GM/100ML-% IV SOLN
2.0000 g | Freq: Once | INTRAVENOUS | Status: DC
  Filled 2016-03-06: qty 100

## 2016-03-06 MED ORDER — ALBUMIN HUMAN 5 % IV SOLN
INTRAVENOUS | Status: AC
Start: 2016-03-06 — End: 2016-03-06
  Filled 2016-03-06: qty 250

## 2016-03-06 MED ORDER — TETANUS-DIPHTH-ACELL PERTUSSIS 5-2.5-18.5 LF-MCG/0.5 IM SUSP
0.5000 mL | Freq: Once | INTRAMUSCULAR | Status: AC
  Administered 2016-03-07: 0.5 mL via INTRAMUSCULAR
  Filled 2016-03-06 (×2): qty 0.5

## 2016-03-06 MED ORDER — DIBUCAINE 1 % RE OINT: 1.0000 "application " | TOPICAL_OINTMENT | RECTAL | Status: DC | PRN

## 2016-03-06 MED ORDER — OXYCODONE HCL 5 MG PO TABS: 10.0000 mg | ORAL_TABLET | ORAL | Status: DC | PRN

## 2016-03-06 MED ORDER — ONDANSETRON HCL 4 MG/2ML IJ SOLN: 4.0000 mg | INTRAMUSCULAR | Status: DC | PRN

## 2016-03-06 MED ORDER — SIMETHICONE 80 MG PO CHEW: 80.0000 mg | CHEWABLE_TABLET | ORAL | Status: DC | PRN

## 2016-03-06 MED ORDER — MIDAZOLAM HCL 2 MG/2ML IJ SOLN
INTRAMUSCULAR | Status: AC
  Filled 2016-03-06: qty 2

## 2016-03-06 MED ORDER — ALBUMIN HUMAN 5 % IV SOLN
12.5000 g | Freq: Once | INTRAVENOUS | Status: AC
  Administered 2016-03-06: 12.5 g via INTRAVENOUS
  Filled 2016-03-06: qty 250

## 2016-03-06 MED ORDER — OXYCODONE HCL 5 MG PO TABS
5.0000 mg | ORAL_TABLET | ORAL | Status: DC | PRN
  Administered 2016-03-06 – 2016-03-07 (×2): 5 mg via ORAL
  Filled 2016-03-06 (×2): qty 1

## 2016-03-06 MED ORDER — FENTANYL 2.5 MCG/ML BUPIVACAINE 1/10 % EPIDURAL INFUSION (WH - ANES): 14.0000 mL/h | INTRAMUSCULAR | Status: DC | PRN

## 2016-03-06 SURGICAL SUPPLY — 19 items
CATH ROBINSON RED A/P 16FR (CATHETERS) ×3 IMPLANT
CLOTH BEACON ORANGE TIMEOUT ST (SAFETY) ×3 IMPLANT
DECANTER SPIKE VIAL GLASS SM (MISCELLANEOUS) IMPLANT
GLOVE BIOGEL PI IND STRL 7.0 (GLOVE) ×2 IMPLANT
GLOVE BIOGEL PI INDICATOR 7.0 (GLOVE) ×4
GOWN STRL REUS W/TWL LRG LVL3 (GOWN DISPOSABLE) ×9 IMPLANT
KIT BERKELEY 1ST TRIMESTER 3/8 (MISCELLANEOUS) ×3 IMPLANT
NS IRRIG 1000ML POUR BTL (IV SOLUTION) ×3 IMPLANT
PACK VAGINAL MINOR WOMEN LF (CUSTOM PROCEDURE TRAY) ×3 IMPLANT
PAD OB MATERNITY 4.3X12.25 (PERSONAL CARE ITEMS) ×3 IMPLANT
PAD PREP 24X48 CUFFED NSTRL (MISCELLANEOUS) ×3 IMPLANT
SET BERKELEY SUCTION TUBING (SUCTIONS) ×3 IMPLANT
SUT VIC AB 3-0 CT1 27 (SUTURE) ×2
SUT VIC AB 3-0 CT1 TAPERPNT 27 (SUTURE) ×1 IMPLANT
TOWEL OR 17X24 6PK STRL BLUE (TOWEL DISPOSABLE) ×6 IMPLANT
VACURETTE 10 RIGID CVD (CANNULA) IMPLANT
VACURETTE 7MM CVD STRL WRAP (CANNULA) IMPLANT
VACURETTE 8 RIGID CVD (CANNULA) IMPLANT
VACURETTE 9 RIGID CVD (CANNULA) IMPLANT

## 2016-03-06 NOTE — Interval H&P Note (Signed)
History and Physical Interval Note:  03/06/2016 3:53 AM  Joanne BonierBrandi A Shippey  has presented today for surgery, with the diagnosis of  Retained products of conception after vaginal delivery.  The various methods of treatment have been discussed with the patient and family. After consideration of risks, benefits and other options for treatment, the patient has consented to  Dilation and Curettage as a surgical intervention .  The patient's history has been reviewed, patient examined, no change in status, stable for surgery.  I have reviewed the patient's chart and labs.  Questions were answered to the patient's satisfaction.     Konrad FelixKULWA,Merrillyn Ackerley WAKURU, MD.

## 2016-03-06 NOTE — Anesthesia Postprocedure Evaluation (Signed)
Anesthesia Post Note  Patient: Joanne Baker  Procedure(s) Performed: Procedure(s) (LRB): MANUAL REMOVAL OF PLACENTA (N/A) SUTURE REPAIR CERVICAL LACERATION (N/A)  Patient location during evaluation: Antenatal Anesthesia Type: MAC Level of consciousness: awake and alert and oriented Pain management: pain level controlled Vital Signs Assessment: post-procedure vital signs reviewed and stable Respiratory status: spontaneous breathing, nonlabored ventilation and respiratory function stable Cardiovascular status: stable Postop Assessment: no signs of nausea or vomiting and adequate PO intake Anesthetic complications: no     Last Vitals:  Vitals:   03/06/16 0800 03/06/16 1214  BP: 122/70 (!) 106/58  Pulse: 98 (!) 107  Resp: 16 16  Temp: 37.2 C 37.3 C    Last Pain:  Vitals:   03/06/16 1214  TempSrc: Oral  PainSc: 3    Pain Goal: Patients Stated Pain Goal: 3 (03/06/16 0021)               Madison HickmanGREGORY,Clyde Zarrella

## 2016-03-06 NOTE — Progress Notes (Signed)
This note also relates to the following rows which could not be included:  BP - Cannot attach notes to unvalidated device data

## 2016-03-06 NOTE — Brief Op Note (Signed)
03/05/2016 - 03/06/2016  4:12 AM  PATIENT:  Joanne Baker  40 y.o. female  PRE-OPERATIVE DIAGNOSIS:  Retained Placenta after vaginal delivery  POST-OPERATIVE DIAGNOSIS:  Same as above  PROCEDURE:   Manual extraction of placenta Left cervical laceration repair  SURGEON:  Surgeon(s) and Role:    * Hoover BrownsEma Khanh Tanori, MD - Primary  ASSISTANTS: Scrub techninicians   ANESTHESIA:   general  EBL:  Total I/O In: -  Out: 250 [Blood:250] before procedure. 500 cc more intra-op.     BLOOD ADMINISTERED:none  DRAINS: none   LOCAL MEDICATIONS USED:  NONE  SPECIMEN:  Source of Specimen:  Placenta  DISPOSITION OF SPECIMEN:  PATHOLOGY  COUNTS:  YES  TOURNIQUET:  * No tourniquets in log *  DICTATION: .Note written in EPIC  PLAN OF CARE: Admit to inpatient   PATIENT DISPOSITION:  PACU - hemodynamically stable.   Delay start of Pharmacological VTE agent (>24hrs) due to surgical blood loss or risk of bleeding: not applicable

## 2016-03-06 NOTE — H&P (View-Only) (Signed)
  Subjective: In bed -- experiencing mild ctxs. Considering Nitrous for pain control.  Objective: BP 127/77   Pulse 76   Temp 98.4 F (36.9 C) (Oral)   Resp 16   Ht 5' 7" (1.702 m)   Wt 84.8 kg (187 lb)   LMP 06/17/2015   SpO2 100%   BMI 29.29 kg/m   Today's Vitals   03/05/16 1854 03/05/16 2032 03/05/16 2225 03/05/16 2228  BP: 126/69 110/62  127/77  Pulse: 80 92  76  Resp:  16  16  Temp:  98.6 F (37 C)  98.4 F (36.9 C)  TempSrc:  Oral  Oral  SpO2:      Weight:      Height:      PainSc:   0-No pain    FHT: BL 135 w/ moderate variability, +accels, earlys, no lates UC:   irregular, every 5-7 minutes SVE:   Dilation: 6.5 Effacement (%): 80 Station: -2 Exam by:: K.Gloristine Turrubiates, CNM @ 2225   Assessment:  IUP at 37.5 wks ROM x 11 1/2 hrs; no s/s of infection No cervical change since admission Cat 1 FHRT GBS neg  Plan: Risks and benefits of augmentation were reviewed, including failure of method, prolonged labor, need for further intervention, and risk of cesarean. Pt verbalized understanding of these risks and wishes to proceed w/ augmentation.  Mandrell Vangilder CNM 03/05/2016, 10:35 PM   

## 2016-03-06 NOTE — Transfer of Care (Signed)
Immediate Anesthesia Transfer of Care Note  Patient: Joanne Baker  Procedure(s) Performed: Procedure(s): MANUAL REMOVAL OF PLACENTA (N/A) SUTURE REPAIR CERVICAL LACERATION (N/A)  Patient Location: PACU  Anesthesia Type:MAC  Level of Consciousness: awake, alert  and oriented  Airway & Oxygen Therapy: Patient Spontanous Breathing and Patient connected to nasal cannula oxygen  Post-op Assessment: Report given to RN and Post -op Vital signs reviewed and stable  Post vital signs: Reviewed and stable  Last Vitals:  Vitals:   03/06/16 0330 03/06/16 0333  BP:  122/66  Pulse: 79 73  Resp:    Temp:      Last Pain:  Vitals:   03/06/16 0021  TempSrc:   PainSc: 9       Patients Stated Pain Goal: 3 (03/06/16 0021)  Complications: No apparent anesthesia complications

## 2016-03-06 NOTE — Op Note (Signed)
Patient: Joanne Baker, [name]  Date of birth: Apr 15, 1976  Date of procedure: 03/06/2016  Preop diagnosis: Retained placenta  Postop diagnosis: Same as above and cervical laceration on left side of cervix.   Procedure:  1. Manual extraction of placenta. 2.  Cervical laceration repair.   Surgeon: Dr. Hoover BrownsEMA Nelida Mandarino.  Assistants: Scrub technician, Asher MuirJamie  Anesthesia: Conscious sedation  Complications: None  Input: 2000cc LR  Output: EBL: 500cc.  Urine: 300cc straight catheterization.   Findings: Uterus firm. No palpable adnexal masses bilaterally. Cervix open with moderate blood coming through the os.  Indications: 40 year-old G7 para 2133 who had a vaginal delivery at 37 weeks 6 days EGA and then with retained placenta and moderate vaginal bleeding.  She was consented for the procedure after explaining the risks, benefits and alternatives of the procedure including risks of bleeding, infection, damage to organs, uterine scarring and Asherman's syndrome.    Procedure: She was taken to the operating room anesthesia was administered without difficulty. Ancef 2g IV was given preoperatively.  She was placed in the dorsal lithotomy position and prepped and draped in the usual sterile fashion. Straight catheterization was performed.  I placed my hands through the vagina and cervix and was able to get a  plane between the placenta and uterus and separated the placenta from the uterus on this plane.  The placenta was then gently tugged and delivered.  It was examined and noted to be grossly intact and normal.  Her bleeding markedly decreased after placenta removal.  A 3cm cervical laceration on left side was noted and this was repaired with 3-0 vicryl.  Excellent hemostasis was noted.  The instruments were then removed and the patient was then awoken from anesthesia she was cleaned and taken to recovery room in stable condition.  Intraop hemoglobin check was 8.  Will follow up patient for signs and  symptoms of anemia and manage accordingly.    Specimen: Placenta, cord blood.

## 2016-03-06 NOTE — Addendum Note (Signed)
Addendum  created 03/06/16 1325 by Shanon PayorSuzanne M Tiffanie Blassingame, CRNA   Sign clinical note

## 2016-03-06 NOTE — Anesthesia Postprocedure Evaluation (Signed)
Anesthesia Post Note  Patient: [name]  Procedure(s) Performed: Procedure(s) (LRB): MANUAL REMOVAL OF PLACENTA (N/A) SUTURE REPAIR CERVICAL LACERATION (N/A)  Patient location during evaluation: PACU Anesthesia Type: MAC Level of consciousness: awake and alert Pain management: pain level controlled Vital Signs Assessment: post-procedure vital signs reviewed and stable Respiratory status: spontaneous breathing, nonlabored ventilation and respiratory function stable Cardiovascular status: stable and blood pressure returned to baseline Anesthetic complications: no     Last Vitals:  Vitals:   03/06/16 0545 03/06/16 0600  BP: 110/64 103/66  Pulse: 90 92  Resp: 14 15  Temp:      Last Pain:  Vitals:   03/06/16 0021  TempSrc:   PainSc: 9    Pain Goal: Patients Stated Pain Goal: 3 (03/06/16 0021)               Linton RumpJennifer Dickerson Emilyn Ruble

## 2016-03-06 NOTE — Progress Notes (Addendum)
Post Partum Day 0  Subjective: Called by RN secondary to pt c/o feeling pightheaded and dizzy upon standing.  IVFs continued.  Upon seeing pt, she still complains of feeling unsteady with amublating and received blood transfusion with last pregnancy.    Objective: Blood pressure (!) 106/58, pulse (!) 107, temperature 99.1 F (37.3 C), temperature source Oral, resp. rate 16, height 5\' 7"  (1.702 m), weight 187 lb (84.8 kg), last menstrual period 06/17/2015, SpO2 99 %, currently breastfeeding.  Physical Exam:  General: alert and no distress Lochia: appropriate Uterine Fundus: firm Incision: n/a DVT Evaluation: No evidence of DVT seen on physical exam.   Recent Labs  03/06/16 0445 03/06/16 0902  HGB 8.1* 7.0*  HCT 22.7* 19.5*    Assessment/Plan: PPD 0 s/p VBAC with return to the OR for retained placenta and EBL 750cc reporting symptomatic anemia. I discussed options and recommendations.  Pt would like to proceed with blood transfusion.  Risks benefits and alternatives were discussed and questions answered. Cont routine PP care Plans OCPs (Breastfeeding)   LOS: 1 day   Modesty Rudy Y 03/06/2016, 2:37 PM

## 2016-03-06 NOTE — Anesthesia Preprocedure Evaluation (Addendum)
Anesthesia Evaluation  Patient identified by MRN, date of birth, ID band Patient awake    Reviewed: Allergy & Precautions, NPO status , Patient's Chart, lab work & pertinent test results  History of Anesthesia Complications Negative for: history of anesthetic complications  Airway Mallampati: II  TM Distance: >3 FB Neck ROM: Full    Dental  (+) Teeth Intact   Pulmonary asthma ,    Pulmonary exam normal breath sounds clear to auscultation       Cardiovascular  Rhythm:Regular Rate:Normal     Neuro/Psych PSYCHIATRIC DISORDERS Anxiety Depression    GI/Hepatic   Endo/Other    Renal/GU      Musculoskeletal   Abdominal   Peds  Hematology  (+) Blood dyscrasia, anemia ,   Anesthesia Other Findings H/o previous c-section, now with successful VBAC presents to OR for manual extraction of retained placenta  Reproductive/Obstetrics                             Anesthesia Physical Anesthesia Plan  ASA: II  Anesthesia Plan: MAC   Post-op Pain Management:    Induction:   Airway Management Planned: Natural Airway and Nasal Cannula  Additional Equipment:   Intra-op Plan:   Post-operative Plan:   Informed Consent: I have reviewed the patients History and Physical, chart, labs and discussed the procedure including the risks, benefits and alternatives for the proposed anesthesia with the patient or authorized representative who has indicated his/her understanding and acceptance.     Plan Discussed with: CRNA  Anesthesia Plan Comments: (Patient received Bicitra prior to coming to OR. NPO >12 hours. Patient does not have an epidural and wishes to avoid neuraxial anesthesia. Plan for conscious sedation for manual extraction of placenta. Patient to remain awake, alert, and spontaneously breathing. If more relaxation is needed, will proceed with general anesthesia.)       Anesthesia Quick  Evaluation

## 2016-03-06 NOTE — Lactation Note (Signed)
This note was copied from a baby's chart. Lactation Consultation Note  P4, Ex BF.  Baby 11 hours old.  She states she bf 2 of her children for 1 year and the other one for 7 months - states she had no supply at that time. Baby has bf after birth and not since then.  Mother states she had attempted. Offered to help her hand express but mother states she wants baby "to have more time".  Suggest she place baby STS but mother would like to wait. Discussed spoon feeding to interest baby in latching.  Mom encouraged to feed baby 8-12 times/24 hours and with feeding cues.  Mom made aware of O/P services, breastfeeding support groups, community resources, and our phone # for post-discharge questions.  Suggest she call if she would like assistance.   Patient Name: Joanne Baker XBJYN'WToday's Date: 03/06/2016     Maternal Data    Feeding Feeding Type: Breast Fed Length of feed: 0 min  LATCH Score/Interventions                      Lactation Tools Discussed/Used     Consult Status      Hardie PulleyBerkelhammer, Ruth Boschen 03/06/2016, 1:32 PM

## 2016-03-07 LAB — TYPE AND SCREEN
ABO/RH(D): B POS
ANTIBODY SCREEN: NEGATIVE
UNIT DIVISION: 0
UNIT DIVISION: 0
Unit division: 0
Unit division: 0
Unit division: 0

## 2016-03-07 LAB — CBC
HCT: 21.8 % — ABNORMAL LOW (ref 36.0–46.0)
Hemoglobin: 7.9 g/dL — ABNORMAL LOW (ref 12.0–15.0)
MCH: 31.2 pg (ref 26.0–34.0)
MCHC: 36.2 g/dL — ABNORMAL HIGH (ref 30.0–36.0)
MCV: 86.2 fL (ref 78.0–100.0)
Platelets: 133 K/uL — ABNORMAL LOW (ref 150–400)
RBC: 2.53 MIL/uL — ABNORMAL LOW (ref 3.87–5.11)
RDW: 14.3 % (ref 11.5–15.5)
WBC: 14.7 K/uL — ABNORMAL HIGH (ref 4.0–10.5)

## 2016-03-07 MED ORDER — MONTELUKAST SODIUM 10 MG PO TABS
10.0000 mg | ORAL_TABLET | Freq: Every day | ORAL | Status: DC
  Filled 2016-03-07: qty 1

## 2016-03-07 MED ORDER — MONTELUKAST SODIUM 10 MG PO TABS
10.0000 mg | ORAL_TABLET | Freq: Every day | ORAL | Status: DC
  Administered 2016-03-07 – 2016-03-08 (×2): 10 mg via ORAL
  Filled 2016-03-07 (×2): qty 1

## 2016-03-07 MED ORDER — INFLUENZA VAC SPLIT QUAD 0.5 ML IM SUSY
0.5000 mL | PREFILLED_SYRINGE | INTRAMUSCULAR | Status: AC
  Administered 2016-03-08: 0.5 mL via INTRAMUSCULAR
  Filled 2016-03-07: qty 0.5

## 2016-03-07 NOTE — Progress Notes (Signed)
Post Partum Day 1 Subjective:  Well. Lochia are normal. Voiding, ambulating, tolerating normal diet. nursing going well. Reports resolution of anemia symptoms with transfusion of 2 units  Objective:2 Blood pressure 104/61, pulse (!) 103, temperature 98.4 F (36.9 C), temperature source Oral, resp. rate 18, height 5\' 7"  (1.702 m), weight 187 lb (84.8 kg), last menstrual period 06/17/2015, SpO2 100 %, currently breastfeeding.  Physical Exam:  General: normal Lochia: appropriate Uterine Fundus: 0/2 firm non-tender  Extremities: No evidence of DVT seen on physical exam. Edema minimal     Recent Labs  03/06/16 0902 03/07/16 0223  HGB 7.0* 7.9*  HCT 19.5* 21.8*    Assessment/Plan: Normal Post-partum. Continue routine post-partum care. Anticipate discharge tomorrow     LOS: 2 days   Amrutha Avera A MD 03/07/2016, 9:16 AM

## 2016-03-07 NOTE — Lactation Note (Addendum)
This note was copied from a baby's chart. Lactation Consultation Note  Patient Name: Joanne Baker ZOXWR'UToday's Date: 03/07/2016 Reason for consult: Initial assessment;Infant < 6lbs   Follow up consult with Exp BF mom of 30 hour old infant. Infant with 7 BF for 15-45 minutes, 2 attempts, 2 voids and 7 stools in 24 hours preceding this assessment. Infant weight 5 lb 9.1 oz with 3% weight loss since birth. LATCH Scores 7-9 by bedside RN. Mom with retained placenta that was surgically removed, anemia and received PRBC transfusion.   Mom reports infant is feeding more frequently but she is concerned infant not able to latch deeply and does not stay on for very long at a time. She reports pain with initial latch that improves with feeding. Nipple care of EBM followed by coconut oil discussed with mom. Mom denies breasts feeling fuller today. Infant currently asleep in mom's arms. Left my number for mom to call when infant wakes up for feeding assessment.   Reviewed all BF information in Taking Care of Baby and Me Booklet. Reviewed I/O and enc mom to maintain feeding log and take to ped appt. Reviewed engorgement prevention/treatment. Reviewed BF basics and advised mom to feed infant STS 8-12 x in 24 hours. Mom reports she is flanging infant's upper lip as needed and is massaging/compressing breast with feeding. She reports she is able to hand express from both breasts.  Enc mom to keep infant awake while at breast to maintain suckling while at breast. Mom reports she does hear infant swallowing while at breast and is noting some intermittent clicking with feedings.   [facility] reviewed, mom aware of OP Services, BF Support Groups and LC phone #. Enc mom to monitor infants voids and stools for adequate output and enc her to attend support groups to have infant weighed. [facility] and is aware to call and make an appointment after d/c. [facility]. Mom has a PIS at  home, she would like a manual pump, will bring back with feeding assessment.      Maternal Data Formula Feeding for Exclusion: No Has patient been taught Hand Expression?: Yes Does the patient have breastfeeding experience prior to this delivery?: Yes  Feeding Feeding Type: Breast Fed  LATCH Score/Interventions                      Lactation Tools Discussed/Used WIC Program: Yes   Consult Status Consult Status: Follow-up Date: 03/07/16 Follow-up type: In-patient    Silas FloodSharon S Markanthony Gedney 03/07/2016, 9:08 AM

## 2016-03-07 NOTE — Lactation Note (Signed)
This note was copied from a baby's chart. Lactation Consultation Note  Patient Name: Joanne Baker NGEXB'MToday's Date: 03/07/2016 Reason for consult: Follow-up assessment;Infant < 6lbs   Mom called for feeding assessment. Infant was awake and alert and cueing to feed, Mom latching infant STS to right breast in cradle hold, enc mom to use cross cradle hold to latch infant. Mom with large compressible breasts and everted nipples. Infant latched easily with flanged lips and intermittent rhythmic suckles and intermittent swallows. Swallow ratio 1:6 or 1:8. Infant needed a lot of stimulation to maintain suckling, mom was using compression/massage with feeding and swallows noted to be increased with compression. Infant was switched to left breast after about 20 minutes and latched easily. She was more sleepy on this breast and needed more stimulation. Infant self detached and was placed in crib.  Attempted to hand express mom, small gtts of colostrum noted to both nipples, none was collected in a spoon. Demonstrated to mom how to spoon feed infant with empty spoon.   Mom is not being d/c home today. DEBP set up with instructions for set up, assembling, disassembling and cleaning of pump parts. Enc mom to pump for 15 minutes post BF and to supplement infant with all EBM. Mom to eat and then will pump.   Plan made and written on board in room:  Breastfeed 8-12 x in 24 hours at first feeding cues with no longer than 3 hours between feedings Supplement with EBM via spoon when available Pump with DEBP for 15 minutes on Initiate setting Hand express post pumping Feed all EBM to infant via spoon.  Follow up tomorrow and prn.    Maternal Data Formula Feeding for Exclusion: No Has patient been taught Hand Expression?: Yes Does the patient have breastfeeding experience prior to this delivery?: Yes  Feeding Feeding Type: Breast Fed Length of feed: 20 min  LATCH Score/Interventions Latch: Repeated  attempts needed to sustain latch, nipple held in mouth throughout feeding, stimulation needed to elicit sucking reflex. Intervention(s): Adjust position;Assist with latch;Breast massage;Breast compression  Audible Swallowing: A few with stimulation Intervention(s): Alternate breast massage;Hand expression;Skin to skin  Type of Nipple: Everted at rest and after stimulation Intervention(s): Double electric pump  Comfort (Breast/Nipple): Soft / non-tender     Hold (Positioning): Assistance needed to correctly position infant at breast and maintain latch. Intervention(s): Breastfeeding basics reviewed;Support Pillows;Position options;Skin to skin  LATCH Score: 7  Lactation Tools Discussed/Used WIC Program: No Pump Review: Setup, frequency, and cleaning;Milk Storage Initiated by:: Joanne StainSharon Alizeh Madril, RN, IBCLC Date initiated:: 03/07/16   Consult Status Consult Status: Follow-up Date: 03/08/16 Follow-up type: In-patient    [name] 03/07/2016, 10:48 AM

## 2016-03-07 NOTE — Clinical Social Work Maternal (Signed)
  CLINICAL SOCIAL WORK MATERNAL/CHILD NOTE  Patient Details  Name: Joanne Baker MRN: [id number] Date of Birth: 1976/02/06  Date:  03/07/2016  Clinical Social Worker Initiating Note:  Laurey Arrow Date/ Time Initiated:  03/06/16/1500     Child's Name:  Joanne Baker   Legal Guardian:  Mother   Need for Interpreter:      Date of Referral:  03/05/16     Reason for Referral:  Behavioral Health Issues, including SI    Referral Source:  Central Nursery   Address:  [address] [place] [address]  Phone number:  [phone]   Household Members:  Self, Minor Children, Significant Other   Natural Supports (not living in the home):  Spouse/significant other, Immediate Family, Extended Family   Professional Supports: Case Metallurgist, Transport planner (OB Transport planner)   Employment: Unemployed   Type of Work:     Education:  Engineer, maintenance Resources:  [employer]   Other Resources:  [employer], Physicist, medical    Cultural/Religious Considerations Which May Impact Care:  Per [employer] Face Sheet [name] is [employer].  Strengths:  Ability to meet basic needs , Home prepared for child , Understanding of illness   Risk Factors/Current Problems:  Mental Health Concerns    Cognitive State:  Distractible , Linear Thinking    Mood/Affect:  Calm , Comfortable , Agitated    CSW Assessment: CSW met with [name] to complete as assessment for hx of anxiety and depression.  [name] appeared distracted when CSW arrived as evidence by [name] attempting to breast feed infant.  CSW offered to return later and [name] was adamant about meeting with CSW at this time.  [name] was polite but did not appeared to be interested.  CSW inquired about [name]'s mental health and [name] acknowledged [name]'s anxiety and depression. [name] communicated that [name] is not currently taking any medications however, meets with a therapist as needed at [facility]. [name] reported that prior to [name]'s  pregnancy confirmation [name] was prescribed Effexor, and decided not to utilize any medication during pregnancy.  Overall [name] report that [name] felt good during pregnancy and did not feel a need to re-start [name]'s medications at this time.  CSW educated [name] about PPD. CSW informed [name] possible supports and interventions to decrease PPD.  CSW also encouraged [name] to seek medical attention if needed for increased signs and symptoms for PPD.   [name] was insightful and appeared knowledgeable about her mental health as evidence by her appropriate responses to CSW questions. [name] did not have any further questions, concerns, or needs at this time.   CSW Plan/Description:  Patient/Family Education , No Further Intervention Required/No Barriers to Discharge   Laurey Arrow, MSW, LCSW Clinical Social Work [phone]    Dimple Nanas, LCSW 03/07/2016, 8:44 AM

## 2016-03-08 ENCOUNTER — Ambulatory Visit: Payer: Self-pay

## 2016-03-08 MED ORDER — FERROUS SULFATE 325 (65 FE) MG PO TABS: 325.0000 mg | ORAL_TABLET | Freq: Two times a day (BID) | ORAL | 3 refills | Status: AC

## 2016-03-08 MED ORDER — NORETHINDRONE 0.35 MG PO TABS: 1.0000 | ORAL_TABLET | Freq: Every day | ORAL | 11 refills | Status: AC

## 2016-03-08 MED ORDER — IBUPROFEN 600 MG PO TABS: 600.0000 mg | ORAL_TABLET | Freq: Four times a day (QID) | ORAL | 2 refills | Status: AC | PRN

## 2016-03-08 NOTE — Lactation Note (Signed)
This note was copied from a baby's chart. Lactation Consultation Note  Patient Name: Joanne Baker AVWUJ'WToday's Date: 03/08/2016 Reason for consult: Other (Comment);Hyperbilirubinemia;Infant weight loss;Infant < 6lbs;Follow-up assessment (Early term infant 8% weight loss, see LC note )  Baby is 6857 hours old and has been consistent at the breast - 15 -40 mins , 5 voids and 16 stools , mom reports the last stool is thinning and less black ( greenish brown )  Supplemented with EBM x2 - .5 and 5 ml of EBM. Serum Bili- rubin - 11.8. Per mom both  breast are fuller, warmer, and milk feels like it is coming in. Right nipple is to tender  To let the baby feed , positional stripe noted and areola is tender to touch. LC recommended to mom to just feed on the left breast until the right breast is healed and no pain.  Per mom has a DEBP ( Medela at home ). Has been using the #24 Flange here. LC recommended to mom to try the #27 on the right breast due to the positional strip.  And #30 's provided in case the #27 are uncomfortable. Encouraged mom to hand express - apply EBM on to nipples liberally, use comfort gels after feedings and pumping.  When warm - rinse with water and stick in the refrigerator. Then use the breast shells.  Discussed sore nipple tx ( mom has coconut oil , instructed on breast shells, comfort gels)  Sore nipple and engorgement prevention and tx.    Maternal Data Has patient been taught Hand Expression?: Yes  Feeding Feeding Type:  (per mom baby has fed recently and supplemented with 5 ml spoon feeding ) Length of feed: 20 min (per mom baby sluggish at the breast )  LATCH Score/Interventions                Intervention(s): Breastfeeding basics reviewed     Lactation Tools Discussed/Used Tools: Pump Breast pump type: Double-Electric Breast Pump (also a manual )   Consult Status Consult Status: Follow-up Date: 03/08/16 Follow-up type: In-patient    Joanne SprangMargaret Ann  [name] 03/08/2016, 12:00 PM

## 2016-03-08 NOTE — Discharge Instructions (Signed)
Contraception Choices Contraception (birth control) is the use of any methods or devices to prevent pregnancy. Below are some methods to help avoid pregnancy. HORMONAL METHODS   Contraceptive implant. This is a thin, plastic tube containing progesterone hormone. It does not contain estrogen hormone. Your health care provider inserts the tube in the inner part of the upper arm. The tube can remain in place for up to 3 years. After 3 years, the implant must be removed. The implant prevents the ovaries from releasing an egg (ovulation), thickens the cervical mucus to prevent sperm from entering the uterus, and thins the lining of the inside of the uterus.  Progesterone-only injections. These injections are given every 3 months by your health care provider to prevent pregnancy. This synthetic progesterone hormone stops the ovaries from releasing eggs. It also thickens cervical mucus and changes the uterine lining. This makes it harder for sperm to survive in the uterus.  Birth control pills. These pills contain estrogen and progesterone hormone. They work by preventing the ovaries from releasing eggs (ovulation). They also cause the cervical mucus to thicken, preventing the sperm from entering the uterus. Birth control pills are prescribed by a health care provider.Birth control pills can also be used to treat heavy periods.  Minipill. This type of birth control pill contains only the progesterone hormone. They are taken every day of each month and must be prescribed by your health care provider.  Birth control patch. The patch contains hormones similar to those in birth control pills. It must be changed once a week and is prescribed by a health care provider.  Vaginal ring. The ring contains hormones similar to those in birth control pills. It is left in the vagina for 3 weeks, removed for 1 week, and then a new one is put back in place. The patient must be comfortable inserting and removing the ring  from the vagina.A health care provider's prescription is necessary.  Emergency contraception. Emergency contraceptives prevent pregnancy after unprotected sexual intercourse. This pill can be taken right after sex or up to 5 days after unprotected sex. It is most effective the sooner you take the pills after having sexual intercourse. Most emergency contraceptive pills are available without a prescription. Check with your pharmacist. Do not use emergency contraception as your only form of birth control. BARRIER METHODS   Female condom. This is a thin sheath (latex or rubber) that is worn over the penis during sexual intercourse. It can be used with spermicide to increase effectiveness.  Female condom. This is a soft, loose-fitting sheath that is put into the vagina before sexual intercourse.  Diaphragm. This is a soft, latex, dome-shaped barrier that must be fitted by a health care provider. It is inserted into the vagina, along with a spermicidal jelly. It is inserted before intercourse. The diaphragm should be left in the vagina for 6 to 8 hours after intercourse.  Cervical cap. This is a round, soft, latex or plastic cup that fits over the cervix and must be fitted by a health care provider. The cap can be left in place for up to 48 hours after intercourse.  Sponge. This is a soft, circular piece of polyurethane foam. The sponge has spermicide in it. It is inserted into the vagina after wetting it and before sexual intercourse.  Spermicides. These are chemicals that kill or block sperm from entering the cervix and uterus. They come in the form of creams, jellies, suppositories, foam, or tablets. They do not require a  prescription. They are inserted into the vagina with an applicator before having sexual intercourse. The process must be repeated every time you have sexual intercourse. INTRAUTERINE CONTRACEPTION  Intrauterine device (IUD). This is a T-shaped device that is put in a woman's uterus  during a menstrual period to prevent pregnancy. There are 2 types:  Copper IUD. This type of IUD is wrapped in copper wire and is placed inside the uterus. Copper makes the uterus and fallopian tubes produce a fluid that kills sperm. It can stay in place for 10 years.  Hormone IUD. This type of IUD contains the hormone progestin (synthetic progesterone). The hormone thickens the cervical mucus and prevents sperm from entering the uterus, and it also thins the uterine lining to prevent implantation of a fertilized egg. The hormone can weaken or kill the sperm that get into the uterus. It can stay in place for 3-5 years, depending on which type of IUD is used. PERMANENT METHODS OF CONTRACEPTION  Female tubal ligation. This is when the woman's fallopian tubes are surgically sealed, tied, or blocked to prevent the egg from traveling to the uterus.  Hysteroscopic sterilization. This involves placing a small coil or insert into each fallopian tube. Your doctor uses a technique called hysteroscopy to do the procedure. The device causes scar tissue to form. This results in permanent blockage of the fallopian tubes, so the sperm cannot fertilize the egg. It takes about 3 months after the procedure for the tubes to become blocked. You must use another form of birth control for these 3 months.  Female sterilization. This is when the female has the tubes that carry sperm tied off (vasectomy).This blocks sperm from entering the vagina during sexual intercourse. After the procedure, the man can still ejaculate fluid (semen). NATURAL PLANNING METHODS  Natural family planning. This is not having sexual intercourse or using a barrier method (condom, diaphragm, cervical cap) on days the woman could become pregnant.  Calendar method. This is keeping track of the length of each menstrual cycle and identifying when you are fertile.  Ovulation method. This is avoiding sexual intercourse during ovulation.  Symptothermal  method. This is avoiding sexual intercourse during ovulation, using a thermometer and ovulation symptoms.  Post-ovulation method. This is timing sexual intercourse after you have ovulated. Regardless of which type or method of contraception you choose, it is important that you use condoms to protect against the transmission of sexually transmitted infections (STIs). Talk with your health care provider about which form of contraception is most appropriate for you.   This information is not intended to replace advice given to you by your health care provider. Make sure you discuss any questions you have with your health care provider.   Document Released: 05/22/2005 Document Revised: 05/27/2013 Document Reviewed: 11/14/2012 Elsevier Interactive Patient Education 2016 [employer] Breastfeeding and Mastitis Mastitis is inflammation of the breast tissue. It can occur in women who are breastfeeding. This can make breastfeeding painful. Mastitis will sometimes go away on its own. Your health care provider will help determine if treatment is needed. CAUSES Mastitis is often associated with a blocked milk (lactiferous) duct. This can happen when too much milk builds up in the breast. Causes of excess milk in the breast can include:  Poor latch-on. If your baby is not latched onto the breast properly, she or he may not empty your breast completely while breastfeeding.  Allowing too much time to pass between feedings.  Wearing a bra or other clothing that is  too tight. This puts extra pressure on the lactiferous ducts so milk does not flow through them as it should. Mastitis can also be caused by a bacterial infection. Bacteria may enter the breast tissue through cuts or openings in the skin. In women who are breastfeeding, this may occur because of cracked or irritated skin. Cracks in the skin are often caused when your baby does not latch on properly to the breast. SIGNS AND SYMPTOMS  Swelling,  redness, tenderness, and pain in an area of the breast.  Swelling of the glands under the arm on the same side.  Fever may or may not accompany mastitis. If an infection is allowed to progress, a collection of pus (abscess) may develop. DIAGNOSIS  Your health care provider can usually diagnose mastitis based on your symptoms and a physical exam. Tests may be done to help confirm the diagnosis. These may include:  Removal of pus from the breast by applying pressure to the area. This pus can be examined in the lab to determine which bacteria are present. If an abscess has developed, the fluid in the abscess can be removed with a needle. This can also be used to confirm the diagnosis and determine the bacteria present. In most cases, pus will not be present.  Blood tests to determine if your body is fighting a bacterial infection.  Mammogram or ultrasound tests to rule out other problems or diseases. TREATMENT  Mastitis that occurs with breastfeeding will sometimes go away on its own. Your health care provider may choose to wait 24 hours after first seeing you to decide whether a prescription medicine is needed. If your symptoms are worse after 24 hours, your health care provider will likely prescribe an antibiotic medicine to treat the mastitis. He or she will determine which bacteria are most likely causing the infection and will then select an appropriate antibiotic medicine. This is sometimes changed based on the results of tests performed to identify the bacteria, or if there is no response to the antibiotic medicine selected. Antibiotic medicines are usually given by mouth. You may also be given medicine for pain. HOME CARE INSTRUCTIONS  Only take over-the-counter or prescription medicines for pain, fever, or discomfort as directed by your health care provider.  If your health care provider prescribed an antibiotic medicine, take the medicine as directed. Make sure you finish it even if you  start to feel better.  Do not wear a tight or underwire bra. Wear a soft, supportive bra.  Increase your fluid intake, especially if you have a fever.  Continue to empty the breast. Your health care provider can tell you whether this milk is safe for your infant or needs to be thrown out. You may be told to stop nursing until your health care provider thinks it is safe for your baby. Use a breast pump if you are advised to stop nursing.  Keep your nipples clean and dry.  Empty the first breast completely before going to the other breast. If your baby is not emptying your breasts completely for some reason, use a breast pump to empty your breasts.  If you go back to work, pump your breasts while at work to stay in time with your nursing schedule.  Avoid allowing your breasts to become overly filled with milk (engorged). SEEK MEDICAL CARE IF:  You have pus-like discharge from the breast.  Your symptoms do not improve with the treatment prescribed by your health care provider within 2 days. SEEK IMMEDIATE  MEDICAL CARE IF:  Your pain and swelling are getting worse.  You have pain that is not controlled with medicine.  You have a red line extending from the breast toward your armpit.  You have a fever or persistent symptoms for more than 2-3 days.  You have a fever and your symptoms suddenly get worse. MAKE SURE YOU:   Understand these instructions.  Will watch your condition.  Will get help right away if you are not doing well or get worse.   This information is not intended to replace advice given to you by your health care provider. Make sure you discuss any questions you have with your health care provider.   Document Released: 09/16/2004 Document Revised: 05/27/2013 Document Reviewed: 12/26/2012 Elsevier Interactive Patient Education [employer]. Breastfeeding Deciding to breastfeed is one of the best choices you can make for you and your baby. A change in hormones  during pregnancy causes your breast tissue to grow and increases the number and size of your milk ducts. These hormones also allow proteins, sugars, and fats from your blood supply to make breast milk in your milk-producing glands. Hormones prevent breast milk from being released before your baby is born as well as prompt milk flow after birth. Once breastfeeding has begun, thoughts of your baby, as well as his or her sucking or crying, can stimulate the release of milk from your milk-producing glands.  BENEFITS OF BREASTFEEDING For Your Baby  Your first milk (colostrum) helps your baby's digestive system function better.  There are antibodies in your milk that help your baby fight off infections.  Your baby has a lower incidence of asthma, allergies, and sudden infant death syndrome.  The nutrients in breast milk are better for your baby than infant formulas and are designed uniquely for your baby's needs.  Breast milk improves your baby's brain development.  Your baby is less likely to develop other conditions, such as childhood obesity, asthma, or type 2 diabetes mellitus. For You  Breastfeeding helps to create a very special bond between you and your baby.  Breastfeeding is convenient. Breast milk is always available at the correct temperature and costs nothing.  Breastfeeding helps to burn calories and helps you lose the weight gained during pregnancy.  Breastfeeding makes your uterus contract to its prepregnancy size faster and slows bleeding (lochia) after you give birth.   Breastfeeding helps to lower your risk of developing type 2 diabetes mellitus, osteoporosis, and breast or ovarian cancer later in life. SIGNS THAT YOUR BABY IS HUNGRY Early Signs of Hunger  Increased alertness or activity.  Stretching.  Movement of the head from side to side.  Movement of the head and opening of the mouth when the corner of the mouth or cheek is stroked (rooting).  Increased sucking  sounds, smacking lips, cooing, sighing, or squeaking.  Hand-to-mouth movements.  Increased sucking of fingers or hands. Late Signs of Hunger  Fussing.  Intermittent crying. Extreme Signs of Hunger Signs of extreme hunger will require calming and consoling before your baby will be able to breastfeed successfully. Do not wait for the following signs of extreme hunger to occur before you initiate breastfeeding:  Restlessness.  A loud, strong cry.  Screaming. BREASTFEEDING BASICS Breastfeeding Initiation  Find a comfortable place to sit or lie down, with your neck and back well supported.  Place a pillow or rolled up blanket under your baby to bring him or her to the level of your breast (if you are  seated). Nursing pillows are specially designed to help support your arms and your baby while you breastfeed.  Make sure that your baby's abdomen is facing your abdomen.  Gently massage your breast. With your fingertips, massage from your chest wall toward your nipple in a circular motion. This encourages milk flow. You may need to continue this action during the feeding if your milk flows slowly.  Support your breast with 4 fingers underneath and your thumb above your nipple. Make sure your fingers are well away from your nipple and your baby's mouth.  Stroke your baby's lips gently with your finger or nipple.  When your baby's mouth is open wide enough, quickly bring your baby to your breast, placing your entire nipple and as much of the colored area around your nipple (areola) as possible into your baby's mouth.  More areola should be visible above your baby's upper lip than below the lower lip.  Your baby's tongue should be between his or her lower gum and your breast.  Ensure that your baby's mouth is correctly positioned around your nipple (latched). Your baby's lips should create a seal on your breast and be turned out (everted).  It is common for your baby to suck about 2-3  minutes in order to start the flow of breast milk. Latching Teaching your baby how to latch on to your breast properly is very important. An improper latch can cause nipple pain and decreased milk supply for you and poor weight gain in your baby. Also, if your baby is not latched onto your nipple properly, he or she may swallow some air during feeding. This can make your baby fussy. Burping your baby when you switch breasts during the feeding can help to get rid of the air. However, teaching your baby to latch on properly is still the best way to prevent fussiness from swallowing air while breastfeeding. Signs that your baby has successfully latched on to your nipple:  Silent tugging or silent sucking, without causing you pain.  Swallowing heard between every 3-4 sucks.  Muscle movement above and in front of his or her ears while sucking. Signs that your baby has not successfully latched on to nipple:  Sucking sounds or smacking sounds from your baby while breastfeeding.  Nipple pain. If you think your baby has not latched on correctly, slip your finger into the corner of your baby's mouth to break the suction and place it between your baby's gums. Attempt breastfeeding initiation again. Signs of Successful Breastfeeding Signs from your baby:  A gradual decrease in the number of sucks or complete cessation of sucking.  Falling asleep.  Relaxation of his or her body.  Retention of a small amount of milk in his or her mouth.  Letting go of your breast by himself or herself. Signs from you:  Breasts that have increased in firmness, weight, and size 1-3 hours after feeding.  Breasts that are softer immediately after breastfeeding.  Increased milk volume, as well as a change in milk consistency and color by the fifth day of breastfeeding.  Nipples that are not sore, cracked, or bleeding. Signs That Your [employer] is Getting Enough Milk  Wetting at least 3 diapers in a 24-hour period. The  urine should be clear and pale yellow by age 40 days.  At least 3 stools in a 24-hour period by age 40 days. The stool should be soft and yellow.  At least 3 stools in a 24-hour period by age 23 days. The  stool should be seedy and yellow.  No loss of weight greater than 10% of birth weight during the first 34 days of age.  Average weight gain of 4-7 ounces (113-198 g) per week after age 18 days.  Consistent daily weight gain by age 7 days, without weight loss after the age of 2 weeks. After a feeding, your baby may spit up a small amount. This is common. BREASTFEEDING FREQUENCY AND DURATION Frequent feeding will help you make more milk and can prevent sore nipples and breast engorgement. Breastfeed when you feel the need to reduce the fullness of your breasts or when your baby shows signs of hunger. This is called "breastfeeding on demand." Avoid introducing a pacifier to your baby while you are working to establish breastfeeding (the first 4-6 weeks after your baby is born). After this time you may choose to use a pacifier. Research has shown that pacifier use during the first year of a baby's life decreases the risk of sudden infant death syndrome (SIDS). Allow your baby to feed on each breast as long as he or she wants. Breastfeed until your baby is finished feeding. When your baby unlatches or falls asleep while feeding from the first breast, offer the second breast. Because newborns are often sleepy in the first few weeks of life, you may need to awaken your baby to get him or her to feed. Breastfeeding times will vary from baby to baby. However, the following rules can serve as a guide to help you ensure that your baby is properly fed:  Newborns (babies 11 weeks of age or younger) may breastfeed every 1-3 hours.  Newborns should not go longer than 3 hours during the day or 5 hours during the night without breastfeeding.  You should breastfeed your baby a minimum of 8 times in a 24-hour period  until you begin to introduce solid foods to your baby at around 37 months of age. BREAST MILK PUMPING Pumping and storing breast milk allows you to ensure that your baby is exclusively fed your breast milk, even at times when you are unable to breastfeed. This is especially important if you are going back to work while you are still breastfeeding or when you are not able to be present during feedings. Your lactation consultant can give you guidelines on how long it is safe to store breast milk. A breast pump is a machine that allows you to pump milk from your breast into a sterile bottle. The pumped breast milk can then be stored in a refrigerator or freezer. Some breast pumps are operated by hand, while others use electricity. Ask your lactation consultant which type will work best for you. Breast pumps can be purchased, but some hospitals and breastfeeding support groups lease breast pumps on a monthly basis. A lactation consultant can teach you how to hand express breast milk, if you prefer not to use a pump. CARING FOR YOUR BREASTS WHILE YOU BREASTFEED Nipples can become dry, cracked, and sore while breastfeeding. The following recommendations can help keep your breasts moisturized and healthy:  Avoid using soap on your nipples.  Wear a supportive bra. Although not required, special nursing bras and tank tops are designed to allow access to your breasts for breastfeeding without taking off your entire bra or top. Avoid wearing underwire-style bras or extremely tight bras.  Air dry your nipples for 3-43mnutes after each feeding.  Use only cotton bra pads to absorb leaked breast milk. Leaking of breast milk between feedings  is normal.  Use lanolin on your nipples after breastfeeding. Lanolin helps to maintain your skin's normal moisture barrier. If you use pure lanolin, you do not need to wash it off before feeding your baby again. Pure lanolin is not toxic to your baby. You may also hand express a  few drops of breast milk and gently massage that milk into your nipples and allow the milk to air dry. In the first few weeks after giving birth, some women experience extremely full breasts (engorgement). Engorgement can make your breasts feel heavy, warm, and tender to the touch. Engorgement peaks within 3-5 days after you give birth. The following recommendations can help ease engorgement:  Completely empty your breasts while breastfeeding or pumping. You may want to start by applying warm, moist heat (in the shower or with warm water-soaked hand towels) just before feeding or pumping. This increases circulation and helps the milk flow. If your baby does not completely empty your breasts while breastfeeding, pump any extra milk after he or she is finished.  Wear a snug bra (nursing or regular) or tank top for 1-2 days to signal your body to slightly decrease milk production.  Apply ice packs to your breasts, unless this is too uncomfortable for you.  Make sure that your baby is latched on and positioned properly while breastfeeding. If engorgement persists after 48 hours of following these recommendations, contact your health care provider or a Science writer. OVERALL HEALTH CARE RECOMMENDATIONS WHILE BREASTFEEDING  Eat healthy foods. Alternate between meals and snacks, eating 3 of each per day. Because what you eat affects your breast milk, some of the foods may make your baby more irritable than usual. Avoid eating these foods if you are sure that they are negatively affecting your baby.  Drink milk, fruit juice, and water to satisfy your thirst (about 10 glasses a day).  Rest often, relax, and continue to take your prenatal vitamins to prevent fatigue, stress, and anemia.  Continue breast self-awareness checks.  Avoid chewing and smoking tobacco. Chemicals from cigarettes that pass into breast milk and exposure to secondhand smoke may harm your baby.  Avoid alcohol and drug use,  including marijuana. Some medicines that may be harmful to your baby can pass through breast milk. It is important to ask your health care provider before taking any medicine, including all over-the-counter and prescription medicine as well as vitamin and herbal supplements. It is possible to become pregnant while breastfeeding. If birth control is desired, ask your health care provider about options that will be safe for your baby. SEEK MEDICAL CARE IF:  You feel like you want to stop breastfeeding or have become frustrated with breastfeeding.  You have painful breasts or nipples.  Your nipples are cracked or bleeding.  Your breasts are red, tender, or warm.  You have a swollen area on either breast.  You have a fever or chills.  You have nausea or vomiting.  You have drainage other than breast milk from your nipples.  Your breasts do not become full before feedings by the fifth day after you give birth.  You feel sad and depressed.  Your baby is too sleepy to eat well.  Your baby is having trouble sleeping.   Your baby is wetting less than 3 diapers in a 24-hour period.  Your baby has less than 3 stools in a 24-hour period.  Your baby's skin or the white part of his or her eyes becomes yellow.   Your baby is  not gaining weight by 34 days of age. SEEK IMMEDIATE MEDICAL CARE IF:  Your baby is overly tired (lethargic) and does not want to wake up and feed.  Your baby develops an unexplained fever.   This information is not intended to replace advice given to you by your health care provider. Make sure you discuss any questions you have with your health care provider.   Document Released: 05/22/2005 Document Revised: 02/10/2015 Document Reviewed: 11/13/2012 Elsevier Interactive Patient Education 2016 [employer]. Iron-Rich Diet Iron is a mineral that helps your body to produce hemoglobin. Hemoglobin is a protein in your red blood cells that carries oxygen to your  body's tissues. Eating too little iron may cause you to feel weak and tired, and it can increase your risk for infection. Eating enough iron is necessary for your body's metabolism, muscle function, and nervous system. Iron is naturally found in many foods. It can also be added to foods or fortified in foods. There are two types of dietary iron:  Heme iron. Heme iron is absorbed by the body more easily than nonheme iron. Heme iron is found in meat, poultry, and fish.  Nonheme iron. Nonheme iron is found in dietary supplements, iron-fortified grains, beans, and vegetables. You may need to follow an iron-rich diet if:  You have been diagnosed with iron deficiency or iron-deficiency anemia.  You have a condition that prevents you from absorbing dietary iron, such as:  Infection in your intestines.  Celiac disease. This involves long-lasting (chronic) inflammation of your intestines.  You do not eat enough iron.  You eat a diet that is high in foods that impair iron absorption.  You have lost a lot of blood.  You have heavy bleeding during your menstrual cycle.  You are pregnant. WHAT IS MY PLAN? Your health care provider may help you to determine how much iron you need per day based on your condition. Generally, when a person consumes sufficient amounts of iron in the diet, the following iron needs are met:  Men.  19-48 years old: 11 mg per day.  71-69 years old: 8 mg per day.  Women.   15-69 years old: 15 mg per day.  76-6 years old: 18 mg per day.  Over 56 years old: 8 mg per day.  Pregnant women: 27 mg per day.  Breastfeeding women: 9 mg per day. WHAT DO I NEED TO KNOW ABOUT AN IRON-RICH DIET?  Eat fresh fruits and vegetables that are high in vitamin C along with foods that are high in iron. This will help increase the amount of iron that your body absorbs from food, especially with foods containing nonheme iron. Foods that are high in vitamin C include oranges,  peppers, tomatoes, and mango.  Take iron supplements only as directed by your health care provider. Overdose of iron can be life-threatening. If you were prescribed iron supplements, take them with orange juice or a vitamin C supplement.  Cook foods in pots and pans that are made from iron.   Eat nonheme iron-containing foods alongside foods that are high in heme iron. This helps to improve your iron absorption.   Certain foods and drinks contain compounds that impair iron absorption. Avoid eating these foods in the same meal as iron-rich foods or with iron supplements. These include:  Coffee, black tea, and red wine.  Milk, dairy products, and foods that are high in calcium.  Beans, soybeans, and peas.  Whole grains.  When eating foods that contain both  nonheme iron and compounds that impair iron absorption, follow these tips to absorb iron better.   Soak beans overnight before cooking.  Soak whole grains overnight and drain them before using.  Ferment flours before baking, such as using yeast in bread dough. WHAT FOODS CAN I EAT? Grains Iron-fortified breakfast cereal. Iron-fortified whole-wheat bread. Enriched rice. Sprouted grains. Vegetables Spinach. Potatoes with skin. Green peas. Broccoli. Red and green bell peppers. Fermented vegetables. Fruits Prunes. Raisins. Oranges. Strawberries. Mango. Grapefruit. Meats and Other Protein Sources Beef liver. Oysters. Beef. Shrimp. [place]. Chicken. [place]. Sardines. Chickpeas. Nuts. Tofu. Beverages Tomato juice. Fresh orange juice. Prune juice. Hibiscus tea. Fortified instant breakfast shakes. Condiments Tahini. Fermented soy sauce. Sweets and Desserts Black-strap molasses.  Other Wheat germ. The items listed above may not be a complete list of recommended foods or beverages. Contact your dietitian for more options. WHAT FOODS ARE NOT RECOMMENDED? Grains Whole grains. Bran cereal. Bran flour.  Oats. Vegetables Artichokes. Brussels sprouts. Kale. Fruits Blueberries. Raspberries. Strawberries. Figs. Meats and Other Protein Sources Soybeans. Products made from soy protein. Dairy Milk. Cream. Cheese. Yogurt. Cottage cheese. Beverages Coffee. Black tea. Red wine. Sweets and Desserts Cocoa. Chocolate. Ice cream. Other Basil. Oregano. Parsley. The items listed above may not be a complete list of foods and beverages to avoid. Contact your dietitian for more information.   This information is not intended to replace advice given to you by your health care provider. Make sure you discuss any questions you have with your health care provider.   Document Released: 01/03/2005 Document Revised: 06/12/2014 Document Reviewed: 12/17/2013 Elsevier Interactive Patient Education 2016 [employer]. Anemia, Nonspecific Anemia is a condition in which the concentration of red blood cells or hemoglobin in the blood is below normal. Hemoglobin is a substance in red blood cells that carries oxygen to the tissues of the body. Anemia results in not enough oxygen reaching these tissues.  CAUSES  Common causes of anemia include:   Excessive bleeding. Bleeding may be internal or external. This includes excessive bleeding from periods (in women) or from the intestine.   Poor nutrition.   Chronic kidney, thyroid, and liver disease.  Bone marrow disorders that decrease red blood cell production.  Cancer and treatments for cancer.  HIV, AIDS, and their treatments.  Spleen problems that increase red blood cell destruction.  Blood disorders.  Excess destruction of red blood cells due to infection, medicines, and autoimmune disorders. SIGNS AND SYMPTOMS   Minor weakness.   Dizziness.   Headache.  Palpitations.   Shortness of breath, especially with exercise.   Paleness.  Cold sensitivity.  Indigestion.  Nausea.  Difficulty sleeping.  Difficulty  concentrating. Symptoms may occur suddenly or they may develop slowly.  DIAGNOSIS  Additional blood tests are often needed. These help your health care provider determine the best treatment. Your health care provider will check your stool for blood and look for other causes of blood loss.  TREATMENT  Treatment varies depending on the cause of the anemia. Treatment can include:   Supplements of iron, vitamin G64, or folic acid.   Hormone medicines.   A blood transfusion. This may be needed if blood loss is severe.   Hospitalization. This may be needed if there is significant continual blood loss.   Dietary changes.  Spleen removal. HOME CARE INSTRUCTIONS Keep all follow-up appointments. It often takes many weeks to correct anemia, and having your health care provider check on your condition and your response to treatment is very important. SEEK  IMMEDIATE MEDICAL CARE IF:   You develop extreme weakness, shortness of breath, or chest pain.   You become dizzy or have trouble concentrating.  You develop heavy vaginal bleeding.   You develop a rash.   You have bloody or black, tarry stools.   You faint.   You vomit up blood.   You vomit repeatedly.   You have abdominal pain.  You have a fever or persistent symptoms for more than 2-3 days.   You have a fever and your symptoms suddenly get worse.   You are dehydrated.  MAKE SURE YOU:  Understand these instructions.  Will watch your condition.  Will get help right away if you are not doing well or get worse.   This information is not intended to replace advice given to you by your health care provider. Make sure you discuss any questions you have with your health care provider.   Document Released: 06/29/2004 Document Revised: 01/22/2013 Document Reviewed: 11/15/2012 Elsevier Interactive Patient Education 2016 [employer]. Postpartum Depression and Baby Blues The postpartum period begins right after  the birth of a baby. During this time, there is often a great amount of joy and excitement. It is also a time of many changes in the life of the parents. Regardless of how many times a mother gives birth, each child brings new challenges and dynamics to the family. It is not unusual to have feelings of excitement along with confusing shifts in moods, emotions, and thoughts. All mothers are at risk of developing postpartum depression or the "baby blues." These mood changes can occur right after giving birth, or they may occur many months after giving birth. The baby blues or postpartum depression can be mild or severe. Additionally, postpartum depression can go away rather quickly, or it can be a long-term condition.  CAUSES Raised hormone levels and the rapid drop in those levels are thought to be a main cause of postpartum depression and the baby blues. A number of hormones change during and after pregnancy. Estrogen and progesterone usually decrease right after the delivery of your baby. The levels of thyroid hormone and various cortisol steroids also rapidly drop. Other factors that play a role in these mood changes include major life events and genetics.  RISK FACTORS If you have any of the following risks for the baby blues or postpartum depression, know what symptoms to watch out for during the postpartum period. Risk factors that may increase the likelihood of getting the baby blues or postpartum depression include:  Having a personal or family history of depression.   Having depression while being pregnant.   Having premenstrual mood issues or mood issues related to oral contraceptives.  Having a lot of life stress.   Having marital conflict.   Lacking a social support network.   Having a baby with special needs.   Having health problems, such as diabetes.  SIGNS AND SYMPTOMS Symptoms of baby blues include:  Brief changes in mood, such as going from extreme happiness to  sadness.  Decreased concentration.   Difficulty sleeping.   Crying spells, tearfulness.   Irritability.   Anxiety.  Symptoms of postpartum depression typically begin within the first month after giving birth. These symptoms include:  Difficulty sleeping or excessive sleepiness.   Marked weight loss.   Agitation.   Feelings of worthlessness.   Lack of interest in activity or food.  Postpartum psychosis is a very serious condition and can be dangerous. Fortunately, it is rare. Displaying any  of the following symptoms is cause for immediate medical attention. Symptoms of postpartum psychosis include:   Hallucinations and delusions.   Bizarre or disorganized behavior.   Confusion or disorientation.  DIAGNOSIS  A diagnosis is made by an evaluation of your symptoms. There are no medical or lab tests that lead to a diagnosis, but there are various questionnaires that a health care provider may use to identify those with the baby blues, postpartum depression, or psychosis. Often, a screening tool called the [place] Postnatal Depression Scale is used to diagnose depression in the postpartum period.  TREATMENT The baby blues usually goes away on its own in 1-2 weeks. Social support is often all that is needed. You will be encouraged to get adequate sleep and rest. Occasionally, you may be given medicines to help you sleep.  Postpartum depression requires treatment because it can last several months or longer if it is not treated. Treatment may include individual or group therapy, medicine, or both to address any social, physiological, and psychological factors that may play a role in the depression. Regular exercise, a healthy diet, rest, and social support may also be strongly recommended.  Postpartum psychosis is more serious and needs treatment right away. Hospitalization is often needed. HOME CARE INSTRUCTIONS  Get as much rest as you can. Nap when the baby sleeps.    Exercise regularly. Some women find yoga and walking to be beneficial.   Eat a balanced and nourishing diet.   Do little things that you enjoy. Have a cup of tea, take a bubble bath, read your favorite magazine, or listen to your favorite music.  Avoid alcohol.   Ask for help with household chores, cooking, grocery shopping, or running errands as needed. Do not try to do everything.   Talk to people close to you about how you are feeling. Get support from your partner, family members, friends, or other new moms.  Try to stay positive in how you think. Think about the things you are grateful for.   Do not spend a lot of time alone.   Only take over-the-counter or prescription medicine as directed by your health care provider.  Keep all your postpartum appointments.   Let your health care provider know if you have any concerns.  SEEK MEDICAL CARE IF: You are having a reaction to or problems with your medicine. SEEK IMMEDIATE MEDICAL CARE IF:  You have suicidal feelings.   You think you may harm the baby or someone else. MAKE SURE YOU:  Understand these instructions.  Will watch your condition.  Will get help right away if you are not doing well or get worse.   This information is not intended to replace advice given to you by your health care provider. Make sure you discuss any questions you have with your health care provider.   Document Released: 02/24/2004 Document Revised: 05/27/2013 Document Reviewed: 03/03/2013 Elsevier Interactive Patient Education 2016 [employer]. Postpartum Care After Vaginal Delivery After you deliver your newborn (postpartum period), the usual stay in the hospital is 24-72 hours. If there were problems with your labor or delivery, or if you have other medical problems, you might be in the hospital longer.  While you are in the hospital, you will receive help and instructions on how to care for yourself and your newborn during the  postpartum period.  While you are in the hospital:  Be sure to tell your nurses if you have pain or discomfort, as well as where you feel  the pain and what makes the pain worse.  If you had an incision made near your vagina (episiotomy) or if you had some tearing during delivery, the nurses may put ice packs on your episiotomy or tear. The ice packs may help to reduce the pain and swelling.  If you are breastfeeding, you may feel uncomfortable contractions of your uterus for a couple of weeks. This is normal. The contractions help your uterus get back to normal size.  It is normal to have some bleeding after delivery.  For the first 1-3 days after delivery, the flow is red and the amount may be similar to a period.  It is common for the flow to start and stop.  In the first few days, you may pass some small clots. Let your nurses know if you begin to pass large clots or your flow increases.  Do not  flush blood clots down the toilet before having the nurse look at them.  During the next 3-10 days after delivery, your flow should become more watery and pink or brown-tinged in color.  Ten to fourteen days after delivery, your flow should be a small amount of yellowish-white discharge.  The amount of your flow will decrease over the first few weeks after delivery. Your flow may stop in 6-8 weeks. Most women have had their flow stop by 12 weeks after delivery.  You should change your sanitary pads frequently.  Wash your hands thoroughly with soap and water for at least 20 seconds after changing pads, using the toilet, or before holding or feeding your newborn.  You should feel like you need to empty your bladder within the first 6-8 hours after delivery.  In case you become weak, lightheaded, or faint, call your nurse before you get out of bed for the first time and before you take a shower for the first time.  Within the first few days after delivery, your breasts may begin to feel  tender and full. This is called engorgement. Breast tenderness usually goes away within 48-72 hours after engorgement occurs. You may also notice milk leaking from your breasts. If you are not breastfeeding, do not stimulate your breasts. Breast stimulation can make your breasts produce more milk.  Spending as much time as possible with your newborn is very important. During this time, you and your newborn can feel close and get to know each other. Having your newborn stay in your room (rooming in) will help to strengthen the bond with your newborn. It will give you time to get to know your newborn and become comfortable caring for your newborn.  Your hormones change after delivery. Sometimes the hormone changes can temporarily cause you to feel sad or tearful. These feelings should not last more than a few days. If these feelings last longer than that, you should talk to your caregiver.  If desired, talk to your caregiver about methods of family planning or contraception.  Talk to your caregiver about immunizations. Your caregiver may want you to have the following immunizations before leaving the hospital:  Tetanus, diphtheria, and pertussis (Tdap) or tetanus and diphtheria (Td) immunization. It is very important that you and your family (including grandparents) or others caring for your newborn are up-to-date with the Tdap or Td immunizations. The Tdap or Td immunization can help protect your newborn from getting ill.  Rubella immunization.  Varicella (chickenpox) immunization.  Influenza immunization. You should receive this annual immunization if you did not receive the immunization during your  pregnancy.   This information is not intended to replace advice given to you by your health care provider. Make sure you discuss any questions you have with your health care provider.   Document Released: 03/19/2007 Document Revised: 02/14/2012 Document Reviewed: 01/17/2012 Elsevier Interactive  Patient Education [employer].

## 2016-03-08 NOTE — Discharge Summary (Signed)
[facility] Discharge Summary   Patient Name:   Joanne Baker DOB:     Apr 23, 1976 MRN:     [id number]  Date of Admission:   03/05/2016 Date of Discharge:  03/08/2016  Admitting diagnosis:    37.5 WKS, WATER BROKE Retained Products Principal Problem:   Vaginal delivery Active Problems:   Advanced maternal age in multigravida   Family history of congenital heart disease   Asthma   Abnormal chromosomal and genetic finding on antenatal screening mother   Genital warts   Vitamin D deficiency   Depressive disorder   Retained placenta  Term Pregnancy Delivered and Anemia    Discharge diagnosis:    37.5 WKS, WATER BROKE Retained Products Principal Problem:   Vaginal delivery Active Problems:   Advanced maternal age in multigravida   Family history of congenital heart disease   Asthma   Abnormal chromosomal and genetic finding on antenatal screening mother   Genital warts   Vitamin D deficiency   Depressive disorder   Retained placenta  Term Pregnancy Delivered and Anemia                                                                     Post partum procedures: blood transfusion, removal of retained placenta in OR  Type of Delivery:             SVB  Delivering Provider: Sherre Scarlet   Date of Delivery:  03/06/16  Newborn Data:    Live born female  Birth Weight: 5 lb 11.8 oz (2603 g) APGARS: 9, 9  Baby's Name:              Otila Kluver Baby Feeding:   Breast Disposition:   home with mother  Complications:   Retained placenta - removed in OR. 3 cm cervical laceration repaired  Hospital course:      Onset of Labor With Vaginal Delivery     40 y.o. yo [id number] at [redacted]w[redacted]d was admitted in Active Labor on 03/05/2016. Patient had an uncomplicated labor course as follows:  Membrane Rupture Time/Date: 11:30 AM ,03/05/2016   Intrapartum Procedures: Episiotomy: None [1]                                         Lacerations:  None [1]  Patient had a delivery  of a Viable infant. 03/06/2016  Information for the patient's newborn:  Dorea, Duff [[id number]]  Delivery Method: VBAC, Spontaneous (Filed from Delivery Summary)    Patient had an uncomplicated postpartum course after receiving 2 units of PRBCs on PPD 1. She is ambulating, tolerating a regular diet, passing flatus, and urinating well. Patient is discharged home in stable condition on 03/08/16.    Physical Exam:   Vitals:   03/06/16 2226 03/07/16 0605 03/07/16 1852 03/08/16 0548  BP: 109/61 104/61 120/67 128/75  Pulse: (!) 103  89 69  Resp: 20 18 18 18   Temp: 98.4 F (36.9 C) 98.4 F (36.9 C) 98.2 F (36.8 C) 97.8 F (36.6 C)  TempSrc:  Oral Oral Oral  SpO2: 100%     Weight:      Height:  General: alert, cooperative and no distress Lochia: appropriate Uterine Fundus: firm Incision: N/A DVT Evaluation: No evidence of DVT seen on physical exam. Negative Homan's sign. No cords or calf tenderness.  Labs: Lab Results  Component Value Date   WBC 14.7 (H) 03/07/2016   HGB 7.9 (L) 03/07/2016   HCT 21.8 (L) 03/07/2016   MCV 86.2 03/07/2016   PLT 133 (L) 03/07/2016   CMP Latest Ref Rng & Units 02/26/2016  Glucose 65 - 99 mg/dL 81  BUN 6 - 20 mg/dL 7  Creatinine 1.610.44 - 0.961.00 mg/dL 0.450.54  Sodium 409135 - 811145 mmol/L 135  Potassium 3.5 - 5.1 mmol/L 3.8  Chloride 101 - 111 mmol/L 108  CO2 22 - 32 mmol/L 19(L)  Calcium 8.9 - 10.3 mg/dL 9.1  Total Protein 6.5 - 8.1 g/dL 6.9  Total Bilirubin 0.3 - 1.2 mg/dL 0.7  Alkaline Phos 38 - 126 U/L 114  AST 15 - 41 U/L 20  ALT 14 - 54 U/L 17   Prenatal labs: ABO, Rh:  B + Antibody:  Neg Rubella:  Immune RPR:   NR HBsAg:   Neg HIV:   NR GBS:   neg  Discharge instruction: per After Visit Summary and "Baby and Me Booklet".  After Visit Meds:    Medication List    STOP taking these medications   diphenhydrAMINE 25 MG tablet Commonly known as:  BENADRYL   pantoprazole 40 MG tablet Commonly known as:  PROTONIX      TAKE these medications   albuterol 108 (90 Base) MCG/ACT inhaler Commonly known as:  PROVENTIL HFA;VENTOLIN HFA Inhale 1-2 puffs into the lungs every 6 (six) hours as needed for wheezing or shortness of breath.   ferrous sulfate 325 (65 FE) MG tablet Take 1 tablet (325 mg total) by mouth 2 (two) times daily with a meal.   ibuprofen 600 MG tablet Commonly known as:  ADVIL,MOTRIN Take 1 tablet (600 mg total) by mouth every 6 (six) hours as needed for fever, headache, mild pain, moderate pain or cramping.   montelukast 10 MG tablet Commonly known as:  SINGULAIR Take 10 mg by mouth daily.   norethindrone 0.35 MG tablet Commonly known as:  MICRONOR,CAMILA,ERRIN Take 1 tablet (0.35 mg total) by mouth daily.   prenatal multivitamin Tabs tablet Take 1 tablet by mouth daily.   Vitamin D 2000 units Caps Take 4,000 Units by mouth daily.       Diet: routine diet  Activity: Advance as tolerated. Pelvic rest for 6 weeks.   Outpatient follow up:6 weeks. CBC at postpartum visit  Postpartum contraception: Progesterone only pills  03/08/2016 Sherre ScarletWILLIAMS, Hades Mathew, CNM

## 2016-03-08 NOTE — Lactation Note (Signed)
This note was copied from a baby's chart. Lactation Consultation Note  Patient Name: Joanne Ulyses JarredBrandi Harton ZYSAY'TToday's Date: 03/08/2016 Reason for consult: Follow-up assessment;Infant < 6lbs;Hyperbilirubinemia;Infant weight loss  [facility] for [facility] - baby awake . LC showed mom how to finger feed with 18F feeding tube. Baby tolerated 5 ml as a appetizer. Baby then latched in football position, with depth . Multiple swallows and fed fro 15 mins and became non - nutritive. LC had mom release suction. Baby fed remained of EBM from a bottle 5 ml.  Total 10 ml. Baby resting on moms chest in the sunny window. LC mentioned to mom finger feeding on her own may be difficult , and using a slow flow small based nipple pace feeding would work better. LC showed mom how to pace feed and baby tolerated well with flanged lips.     Maternal Data Has patient been taught Hand Expression?: Yes  Feeding Feeding Type: Breast Fed Length of feed:  (mutliple swallows )  LATCH Score/Interventions Latch: Grasps breast easily, tongue down, lips flanged, rhythmical sucking. Intervention(s): Adjust position;Assist with latch;Breast massage;Breast compression  Audible Swallowing: Spontaneous and intermittent  Type of Nipple: Everted at rest and after stimulation  Comfort (Breast/Nipple): Filling, red/small blisters or bruises, mild/mod discomfort  Problem noted: Filling  Hold (Positioning): Assistance needed to correctly position infant at breast and maintain latch. Intervention(s): Breastfeeding basics reviewed;Support Pillows;Position options;Skin to skin  LATCH Score: 8  Lactation Tools Discussed/Used Tools: 18F feeding tube / Syringe Breast pump type: Double-Electric Breast Pump (also a manual )   Consult Status Consult Status: Follow-up Date: 03/08/16 Follow-up type: In-patient    Joanne Baker 03/08/2016, 12:52 PM

## 2016-03-09 ENCOUNTER — Ambulatory Visit: Payer: Self-pay

## 2016-03-09 NOTE — Lactation Note (Signed)
This note was copied from a baby's chart. Lactation Consultation Note  Patient Name: Joanne Baker AOZHY'QToday's Date: 03/09/2016 Reason for consult: Follow-up assessment   Mom  BF baby with some discomfort.  Blistering noted on right nipple.  Mom using gels and shells for sore nipples on both sides.  Mom given NS 24 to facilitate wider latch and provide barrier for soreness.  Pt. Expressed much for comfort and lc observed baby in great suck swallow pattern for 25 minutes.  Mom's breast filling and LC provided engorgement information and questions answered.  Provided mom with [facility] outpatient number and encouraged to attend BF support groups and call with any concerns or questions.    Maternal Data    Feeding Feeding Type: Breast Fed Length of feed:  (still feeding after 25 minutes)  LATCH Score/Interventions Latch: Repeated attempts needed to sustain latch, nipple held in mouth throughout feeding, stimulation needed to elicit sucking reflex. Intervention(s): Assist with latch;Breast massage;Breast compression;Adjust position  Audible Swallowing: Spontaneous and intermittent Intervention(s): Skin to skin;Hand expression;Alternate breast massage  Type of Nipple: Everted at rest and after stimulation Intervention(s): Shells  Comfort (Breast/Nipple): Filling, red/small blisters or bruises, mild/mod discomfort  Problem noted: Filling;Cracked, bleeding, blisters, bruises;Mild/Moderate discomfort Interventions (Filling): Massage;Double electric pump;Frequent nursing (ns provided for barrier and enc. more open mouth with baby) Interventions  (Cracked/bleeding/bruising/blister): Expressed breast milk to nipple Interventions (Mild/moderate discomfort): Hand expression;Post-pump;Pre-pump if needed;Comfort gels;Breast shields  Hold (Positioning): No assistance needed to correctly position infant at breast. Intervention(s): Breastfeeding basics reviewed;Support Pillows;Skin to skin  LATCH Score:  8  Lactation Tools Discussed/Used Tools: Shells;Nipple Shields;Comfort gels;Pump Nipple shield size: 24 Breast pump type: Double-Electric Breast Pump   Consult Status Consult Status: Complete Date: 03/09/16 Follow-up type: In-patient    Maryruth HancockKelly Suzanne [facility] 03/09/2016, 12:52 PM

## 2016-03-10 ENCOUNTER — Telehealth (HOSPITAL_COMMUNITY): Payer: Self-pay | Admitting: Lactation Services

## 2016-03-10 NOTE — Telephone Encounter (Signed)
Returning mom's call on answering machine. Mom reports she is feeding on one breast and pumping the second breast. She reports she has a small infant and is using a NS due to nipple soreness. She feels she is not getting enough milk out of the breast with the manual pump. She has an electric pump at home and reports she is not able to get the pump to turn on. She has a hand pump and is pumping and reports her breast is still hard after pumping. Discussed engorgement prevention/treatment and using ice before pumping. She is taking Motrin. She reports she does not feel like she is engorged but really full and lumpy, enc her to massage and pump until breast is empty. Discussed her using the [employer] handle manual pump and told her how to increase suction via the rubber ring. Mom has a WIC appt on 10/15. Advised her to call back if still needs assistance. She is not at home right now and plans to try the other manual pump when she gets home.

## 2016-03-14 ENCOUNTER — Ambulatory Visit (HOSPITAL_COMMUNITY): Payer: Medicaid Other | Admitting: Anesthesiology

## 2016-03-14 ENCOUNTER — Encounter (HOSPITAL_COMMUNITY)
Admission: RE | Disposition: A | Discharge status: Home / Self Care | Payer: Self-pay | Source: Ambulatory Visit | Attending: Obstetrics and Gynecology

## 2016-03-14 ENCOUNTER — Ambulatory Visit (HOSPITAL_COMMUNITY): Payer: Medicaid Other

## 2016-03-14 ENCOUNTER — Encounter (HOSPITAL_COMMUNITY): Payer: Self-pay | Admitting: *Deleted

## 2016-03-14 ENCOUNTER — Ambulatory Visit (HOSPITAL_COMMUNITY)
Admission: RE | Admit: 2016-03-14 | Discharge: 2016-03-14 | Disposition: A | Discharge status: Home / Self Care | Payer: Medicaid Other | Source: Ambulatory Visit | Attending: Obstetrics and Gynecology | Admitting: Obstetrics and Gynecology

## 2016-03-14 DIAGNOSIS — R58 Hemorrhage, not elsewhere classified: Secondary | ICD-10-CM

## 2016-03-14 HISTORY — PX: DILATION AND CURETTAGE OF UTERUS: SHX78

## 2016-03-14 LAB — CBC
HCT: 28.6 % — ABNORMAL LOW (ref 36.0–46.0)
Hemoglobin: 9.5 g/dL — ABNORMAL LOW (ref 12.0–15.0)
MCH: 29.8 pg (ref 26.0–34.0)
MCHC: 33.2 g/dL (ref 30.0–36.0)
MCV: 89.7 fL (ref 78.0–100.0)
PLATELETS: 329 10*3/uL (ref 150–400)
RBC: 3.19 MIL/uL — AB (ref 3.87–5.11)
RDW: 14.5 % (ref 11.5–15.5)
WBC: 6.9 10*3/uL (ref 4.0–10.5)

## 2016-03-14 SURGERY — DILATION AND CURETTAGE
Anesthesia: Monitor Anesthesia Care | Site: Vagina

## 2016-03-14 MED ORDER — HYDROMORPHONE HCL 1 MG/ML IJ SOLN
0.2500 mg | INTRAMUSCULAR | Status: DC | PRN
  Administered 2016-03-14 (×2): 0.25 mg via INTRAVENOUS

## 2016-03-14 MED ORDER — ONDANSETRON HCL 4 MG/2ML IJ SOLN: 4.0000 mg | Freq: Once | INTRAMUSCULAR | Status: DC | PRN

## 2016-03-14 MED ORDER — CEFAZOLIN SODIUM-DEXTROSE 2-4 GM/100ML-% IV SOLN
2.0000 g | Freq: Once | INTRAVENOUS | Status: AC
  Administered 2016-03-14: 2 g via INTRAVENOUS
  Filled 2016-03-14: qty 100

## 2016-03-14 MED ORDER — FENTANYL CITRATE (PF) 100 MCG/2ML IJ SOLN
INTRAMUSCULAR | Status: DC | PRN
  Administered 2016-03-14 (×3): 50 ug via INTRAVENOUS

## 2016-03-14 MED ORDER — HYDROMORPHONE HCL 1 MG/ML IJ SOLN: 0.2500 mg | INTRAMUSCULAR | Status: DC | PRN

## 2016-03-14 MED ORDER — HYDROMORPHONE HCL 1 MG/ML IJ SOLN
INTRAMUSCULAR | Status: AC
Start: 2016-03-14 — End: 2016-03-14
  Administered 2016-03-14: 0.25 mg via INTRAVENOUS
  Filled 2016-03-14: qty 1

## 2016-03-14 MED ORDER — MIDAZOLAM HCL 2 MG/2ML IJ SOLN
INTRAMUSCULAR | Status: DC | PRN
  Administered 2016-03-14: 2 mg via INTRAVENOUS

## 2016-03-14 MED ORDER — CHLOROPROCAINE HCL 1 % IJ SOLN
INTRAMUSCULAR | Status: AC
  Filled 2016-03-14: qty 30

## 2016-03-14 MED ORDER — PROPOFOL 10 MG/ML IV BOLUS
INTRAVENOUS | Status: DC | PRN
  Administered 2016-03-14 (×16): 20 mg via INTRAVENOUS

## 2016-03-14 MED ORDER — ONDANSETRON HCL 4 MG/2ML IJ SOLN
INTRAMUSCULAR | Status: DC | PRN
  Administered 2016-03-14: 4 mg via INTRAVENOUS

## 2016-03-14 MED ORDER — CHLOROPROCAINE HCL 1 % IJ SOLN
INTRAMUSCULAR | Status: DC | PRN
  Administered 2016-03-14: 20 mL

## 2016-03-14 MED ORDER — LIDOCAINE HCL (CARDIAC) 20 MG/ML IV SOLN
INTRAVENOUS | Status: DC | PRN
  Administered 2016-03-14: 50 mg via INTRAVENOUS

## 2016-03-14 MED ORDER — SCOPOLAMINE 1 MG/3DAYS TD PT72
MEDICATED_PATCH | TRANSDERMAL | Status: AC
  Administered 2016-03-14: 1.5 mg via TRANSDERMAL
  Filled 2016-03-14: qty 1

## 2016-03-14 MED ORDER — SCOPOLAMINE 1 MG/3DAYS TD PT72
1.0000 | MEDICATED_PATCH | Freq: Once | TRANSDERMAL | Status: DC
  Administered 2016-03-14: 1.5 mg via TRANSDERMAL

## 2016-03-14 MED ORDER — ONDANSETRON HCL 4 MG/2ML IJ SOLN
4.0000 mg | Freq: Once | INTRAMUSCULAR | Status: DC | PRN
Start: 2016-03-14 — End: 2016-03-14

## 2016-03-14 MED ORDER — METHYLERGONOVINE MALEATE 0.2 MG PO TABS: 0.2000 mg | ORAL_TABLET | Freq: Three times a day (TID) | ORAL | 0 refills | Status: AC

## 2016-03-14 MED ORDER — DEXAMETHASONE SODIUM PHOSPHATE 4 MG/ML IJ SOLN
INTRAMUSCULAR | Status: AC
  Filled 2016-03-14: qty 1

## 2016-03-14 MED ORDER — MEPERIDINE HCL 25 MG/ML IJ SOLN: 6.2500 mg | INTRAMUSCULAR | Status: DC | PRN

## 2016-03-14 MED ORDER — ONDANSETRON HCL 4 MG/2ML IJ SOLN
INTRAMUSCULAR | Status: AC
  Filled 2016-03-14: qty 2

## 2016-03-14 MED ORDER — MIDAZOLAM HCL 2 MG/2ML IJ SOLN
INTRAMUSCULAR | Status: AC
  Filled 2016-03-14: qty 2

## 2016-03-14 MED ORDER — LACTATED RINGERS IV SOLN
INTRAVENOUS | Status: DC
  Administered 2016-03-14 (×3): via INTRAVENOUS

## 2016-03-14 MED ORDER — KETOROLAC TROMETHAMINE 30 MG/ML IJ SOLN
INTRAMUSCULAR | Status: DC | PRN
  Administered 2016-03-14: 30 mg via INTRAVENOUS

## 2016-03-14 MED ORDER — SUCCINYLCHOLINE CHLORIDE 200 MG/10ML IV SOSY
PREFILLED_SYRINGE | INTRAVENOUS | Status: AC
  Filled 2016-03-14: qty 10

## 2016-03-14 MED ORDER — FENTANYL CITRATE (PF) 250 MCG/5ML IJ SOLN
INTRAMUSCULAR | Status: AC
  Filled 2016-03-14: qty 5

## 2016-03-14 SURGICAL SUPPLY — 16 items
CATH FOLEY 2WAY SLVR  5CC 16FR (CATHETERS) ×2
CATH FOLEY 2WAY SLVR 5CC 16FR (CATHETERS) ×1 IMPLANT
CATH ROBINSON RED A/P 16FR (CATHETERS) ×3 IMPLANT
CLOTH BEACON ORANGE TIMEOUT ST (SAFETY) ×3 IMPLANT
CONTAINER PREFILL 10% NBF 60ML (FORM) ×6 IMPLANT
DECANTER SPIKE VIAL GLASS SM (MISCELLANEOUS) ×3 IMPLANT
GLOVE BIOGEL PI IND STRL 7.0 (GLOVE) ×2 IMPLANT
GLOVE BIOGEL PI INDICATOR 7.0 (GLOVE) ×4
GOWN STRL REUS W/TWL LRG LVL3 (GOWN DISPOSABLE) ×6 IMPLANT
PACK VAGINAL MINOR WOMEN LF (CUSTOM PROCEDURE TRAY) ×3 IMPLANT
PAD OB MATERNITY 4.3X12.25 (PERSONAL CARE ITEMS) ×3 IMPLANT
PAD PREP 24X48 CUFFED NSTRL (MISCELLANEOUS) ×3 IMPLANT
SET BERKELEY SUCTION TUBING (SUCTIONS) ×3 IMPLANT
SYRINGE 60CC LL (MISCELLANEOUS) ×3 IMPLANT
TOWEL OR 17X24 6PK STRL BLUE (TOWEL DISPOSABLE) ×3 IMPLANT
WATER STERILE IRR 1000ML POUR (IV SOLUTION) ×3 IMPLANT

## 2016-03-14 NOTE — Anesthesia Postprocedure Evaluation (Signed)
Anesthesia Post Note  Patient: Shian A Cdebaca  Procedure(s) Performed: Procedure(s) (LRB): DILATATION AND CURETTAGE (N/A)  Patient location during evaluation: PACU Anesthesia Type: General Level of consciousness: awake and alert Pain management: pain level controlled Vital Signs Assessment: post-procedure vital signs reviewed and stable Respiratory status: spontaneous breathing, nonlabored ventilation, respiratory function stable and patient connected to nasal cannula oxygen Cardiovascular status: blood pressure returned to baseline and stable Postop Assessment: no signs of nausea or vomiting Anesthetic complications: no     Last Vitals:  Vitals:   03/14/16 1130 03/14/16 1145  BP: 109/74 115/75  Pulse: (!) 55 (!) 53  Resp: 12 10  Temp:      Last Pain:  Vitals:   03/14/16 1145  TempSrc:   PainSc: 3    Pain Goal: Patients Stated Pain Goal: 3 (03/14/16 1145)               Lemoyne Scarpati DAVID

## 2016-03-14 NOTE — Discharge Instructions (Signed)
Post Anesthesia Home Care Instructions  Activity: Get plenty of rest for the remainder of the day. A responsible adult should stay with you for 24 hours following the procedure.  For the next 24 hours, DO NOT: -Drive a car -Advertising copywriterperate machinery -Drink alcoholic beverages -Take any medication unless instructed by your physician -Make any legal decisions or sign important papers.  Meals: Start with liquid foods such as gelatin or soup. Progress to regular foods as tolerated. Avoid greasy, spicy, heavy foods. If nausea and/or vomiting occur, drink only clear liquids until the nausea and/or vomiting subsides. Call your physician if vomiting continues.  Special Instructions/Symptoms: Your throat may feel dry or sore from the anesthesia or the breathing tube placed in your throat during surgery. If this causes discomfort, gargle with warm salt water. The discomfort should disappear within 24 hours.  If you had a scopolamine patch placed behind your ear for the management of post- operative nausea and/or vomiting:  1. The medication in the patch is effective for 72 hours, after which it should be removed.  Wrap patch in a tissue and discard in the trash. Wash hands thoroughly with soap and water. 2. You may remove the patch earlier than 72 hours if you experience unpleasant side effects which may include dry mouth, dizziness or visual disturbances. 3. Avoid touching the patch. Wash your hands with soap and water after contact with the patch.    Post Anesthesia Home Care Instructions  Activity: Get plenty of rest for the remainder of the day. A responsible adult should stay with you for 24 hours following the procedure.  For the next 24 hours, DO NOT: -Drive a car -Advertising copywriterperate machinery -Drink alcoholic beverages -Take any medication unless instructed by your physician -Make any legal decisions or sign important papers.  Meals: Start with liquid foods such as gelatin or soup. Progress to  regular foods as tolerated. Avoid greasy, spicy, heavy foods. If nausea and/or vomiting occur, drink only clear liquids until the nausea and/or vomiting subsides. Call your physician if vomiting continues.  Special Instructions/Symptoms: Your throat may feel dry or sore from the anesthesia or the breathing tube placed in your throat during surgery. If this causes discomfort, gargle with warm salt water. The discomfort should disappear within 24 hours.  If you had a scopolamine patch placed behind your ear for the management of post- operative nausea and/or vomiting:  1. The medication in the patch is effective for 72 hours, after which it should be removed.  Wrap patch in a tissue and discard in the trash. Wash hands thoroughly with soap and water. 2. You may remove the patch earlier than 72 hours if you experience unpleasant side effects which may include dry mouth, dizziness or visual disturbances. 3. Avoid touching the patch. Wash your hands with soap and water after contact with the patch.   POST-OPERATIVE INSTRUCTIONS TO PATIENT  Call [facility]  [phone])  for excessive pain, bleeding or temperature greater than or equal to 100.4 degrees (orally).    No driving for 1 day No sexual activity for 4 weeks  Pain management:  Use Ibuprofen 600 mg every 6 hours for 5 days and then as needed. Use your pain medication as needed to maintain a pain level at or below 3/10 Use Colace 1-2 capsules per day as long as you are using pain medication to avoid constipation.       Diet: normal  Bathing: may shower day of surgery  Wound Care: N/A  Return to [facility] for your scheduled post-partum visit     RIVARD,SANDRA A MD 03/14/2016 10:07 AM

## 2016-03-14 NOTE — H&P (Signed)
  Reason for admission:   Retained products of conception  History:     Joanne Baker is a 40 y.o. female, [id number], [facility].  Patient had a SVD 03/06/16 complicated by retained placenta which required manual extraction in the OR. She was also found to have a 3 cm cervical laceration which was repaired at the same time. She was seen yesterday in the office with c/o persistent heavy bleeding. An ultrasound showed likely retained POC and Hgb was 9.7 ( discharge Hgb was 7.9 following transfusion of 2 units of PRC). She was started on Augmentin last night.   Review of system:  Non-contributory   Past Medical History:   Past Medical History:  Diagnosis Date  . Anemia   . Anxiety 2014  . Asthma   . Depression 2014  . Genital warts   . Postpartum hemorrhage    after 2nd delivery    Allergies:  NKDA  Social History:   African-American, divorced, non-smoker and works as an Ambulance personesthetician  Family History:   Hypertension: father Diabetes: father, PGF Cardio-vascular disease: father, daughter, PGM  Physical exam:    BP 104/64 Weight: 174 lbs   Height: 5 ' 7 ''   BMI 28 General Appearance: Alert, appropriate appearance for age. No acute distress Neck / Thyroid: Supple, no masses, nodes or enlargement Lungs: clear to auscultation bilaterally Back: No CVA tenderness. Cardiovascular: Regular rate and rhythm. S1, S2, no murmur Gastrointestinal: Soft, non-tender, no masses or organomegaly Pelvic Exam: Vulva and vagina appear normal. Bimanual exam reveals normal uterus but tender and normal adnexa.   Assessment:   Post-partum day #8 with suspected retained products of conception   Plan:    Proceed with D&C Procedure, risks and benefits reviewed with patient including but not limited to bleeding, infection, injury to uterus and post-op endometritis. Patient voiced understanding and is agreeable to proceed.      Silverio LayIVARD,Bill Yohn A MD

## 2016-03-14 NOTE — Op Note (Signed)
Preoperative diagnosis: Suspected retained placenta 8 days post-partum  Postoperative diagnosis: Same  Anesthesia: IV sedation and paracervical block  Anesthesiologist: Dr. Michelle Piperssey  Procedure: Dilatation and curettage  Surgeon: Dr. Dois DavenportSandra Maresha Anastos  Estimated blood loss: 100 cc  Procedure:  After being informed of the planned procedure with possible complications including bleeding, infection and injury to uterus, informed consent is obtained and patient is taken to or #3. She is placed in the lithotomy position and given IV sedation without complication. She is then prepped and draped in a sterile  fashion and her bladder is emptied with an in and out red rubber catheter.  Pelvic exam reveals a anteverted  uterus compatible with [redacted] weeks gestation. Both adnexa are felt and normal.  A weighted speculum is inserted in the vagina and the anterior lip of the cervix was grasped with a tenaculum forcep. We proceed with a paracervical block using 20 cc of Nesacaine 1%. The uterus was then sounded at 12 cm. The cervix  easily allowed Hegar dilator until  #37. Using a large sharp curette, we proceed with curettage of the uterine cavity without difficulty returning decidua and clots. Ultrasound was called in the room and after backfilling the bladder with 180 cc of saline, we were able to confirm complete evacuation of products / clots.  Instruments are then removed. Instrument and sponge count is complete x2.   Estimated blood loss is 100 cc.  The procedure is very well tolerated by the patient is taken to recovery room in a well and stable condition.  Specimen: Products of conception sent to pathology

## 2016-03-14 NOTE — Anesthesia Preprocedure Evaluation (Addendum)
Anesthesia Evaluation  Patient identified by MRN, date of birth, ID band Patient awake    Reviewed: Allergy & Precautions, NPO status , Patient's Chart, lab work & pertinent test results  Airway Mallampati: I  TM Distance: >3 FB Neck ROM: Full    Dental   Pulmonary asthma ,    Pulmonary exam normal        Cardiovascular Normal cardiovascular exam     Neuro/Psych Anxiety Depression    GI/Hepatic   Endo/Other    Renal/GU      Musculoskeletal   Abdominal   Peds  Hematology   Anesthesia Other Findings   Reproductive/Obstetrics                             Anesthesia Physical Anesthesia Plan  ASA: II  Anesthesia Plan: MAC   Post-op Pain Management:    Induction: Intravenous  Airway Management Planned: Simple Face Mask  Additional Equipment:   Intra-op Plan:   Post-operative Plan:   Informed Consent: I have reviewed the patients History and Physical, chart, labs and discussed the procedure including the risks, benefits and alternatives for the proposed anesthesia with the patient or authorized representative who has indicated his/her understanding and acceptance.     Plan Discussed with: CRNA and Surgeon  Anesthesia Plan Comments:        Anesthesia Quick Evaluation

## 2016-03-14 NOTE — OR Nursing (Signed)
U/S called to room per Dr. Estanislado Pandyivard for U/S guidance at 1038. U/S arrived at 1045. Ordered entered by [employer]adark[employer] Pet[employer] CorporationCFrediani,RN

## 2016-03-14 NOTE — Transfer of Care (Signed)
Immediate Anesthesia Transfer of Care Note  Patient: Joanne Baker  Procedure(s) Performed: Procedure(s): DILATATION AND CURETTAGE (N/A)  Patient Location: PACU  Anesthesia Type:MAC  Level of Consciousness: awake, alert  and oriented  Airway & Oxygen Therapy: Patient Spontanous Breathing and Patient connected to nasal cannula oxygen  Post-op Assessment: Report given to RN and Post -op Vital signs reviewed and stable  Post vital signs: Reviewed and stable  Last Vitals:  Vitals:   03/14/16 0920  BP: 135/90  Pulse: 65  Resp: 18  Temp: 36.9 C    Last Pain:  Vitals:   03/14/16 0920  TempSrc: Oral  PainSc: 6       Patients Stated Pain Goal: 3 (03/14/16 0920)  Complications: No apparent anesthesia complications

## 2016-03-16 ENCOUNTER — Encounter (HOSPITAL_COMMUNITY): Payer: Self-pay | Admitting: Obstetrics and Gynecology

## 2016-11-20 IMAGING — US US MFM OB LIMITED
1 series · 15 of 23 positions shown · non-contrast
Comparison: none

[Series 1: us mfm ob limited · 15 of 23 slices shown]
[im 1/23]
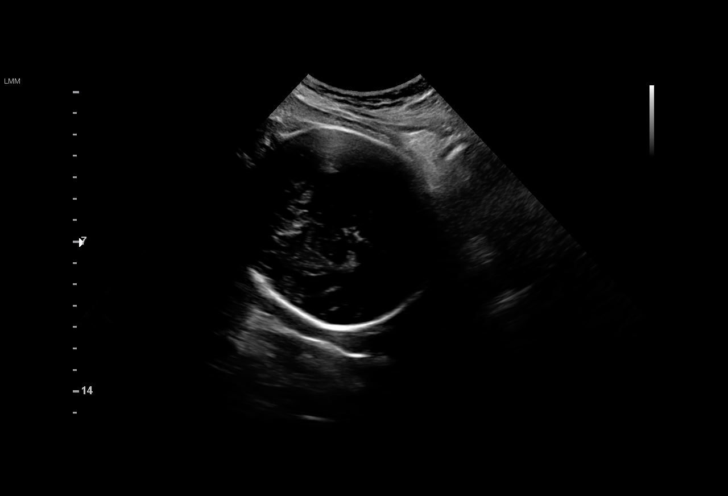
[im 3/23]
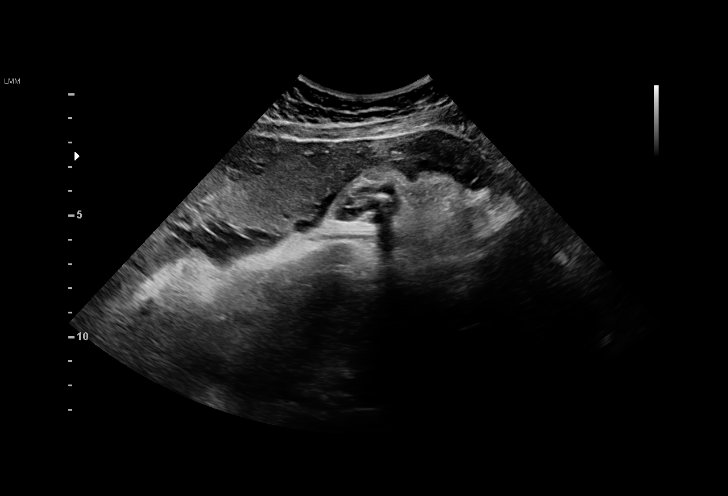
[im 4/23]
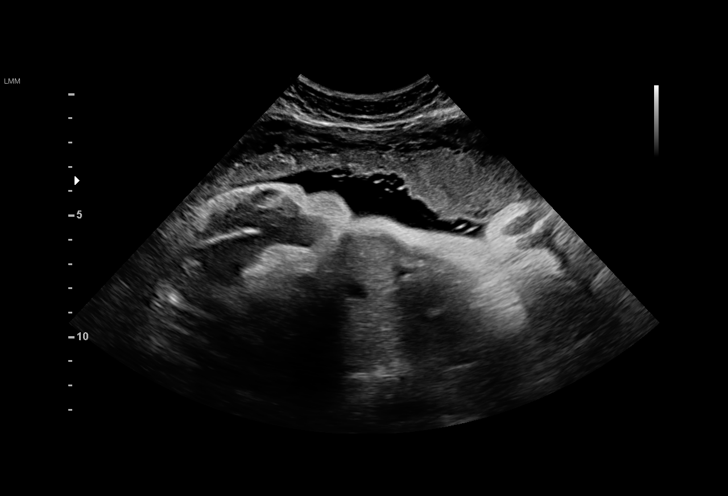
[im 6/23]
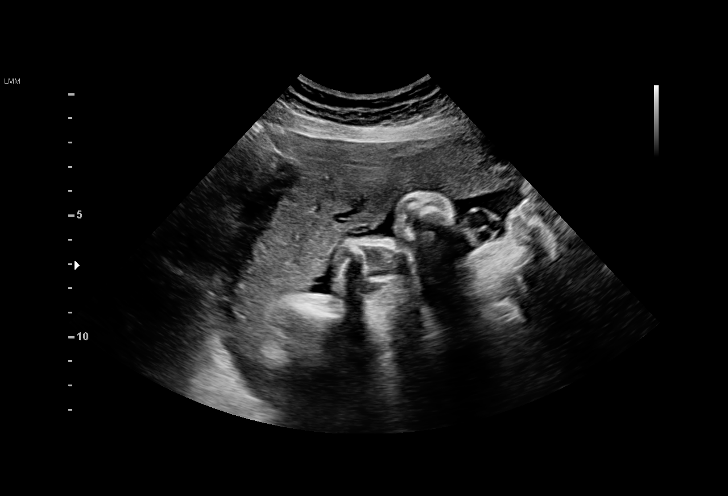
[im 7/23]
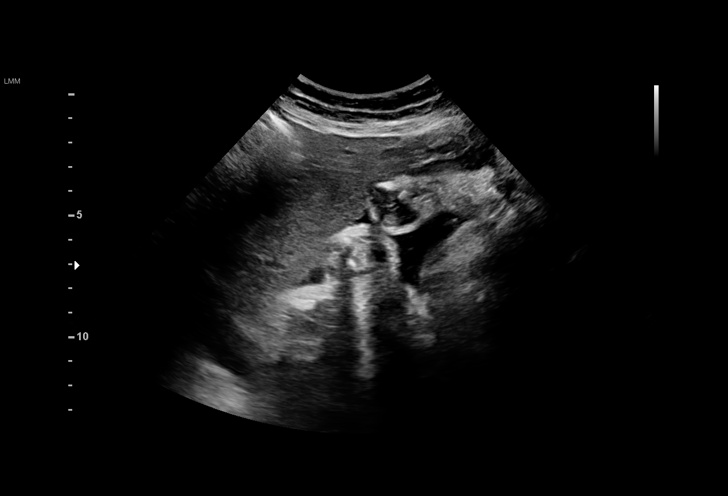
[im 9/23]
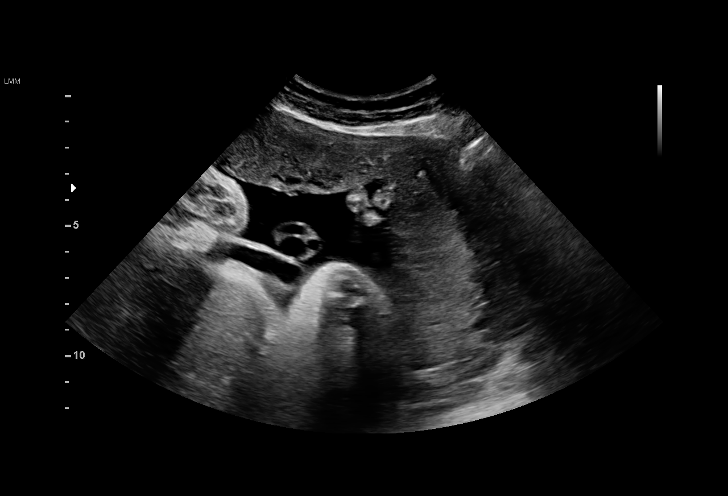
[im 10/23]
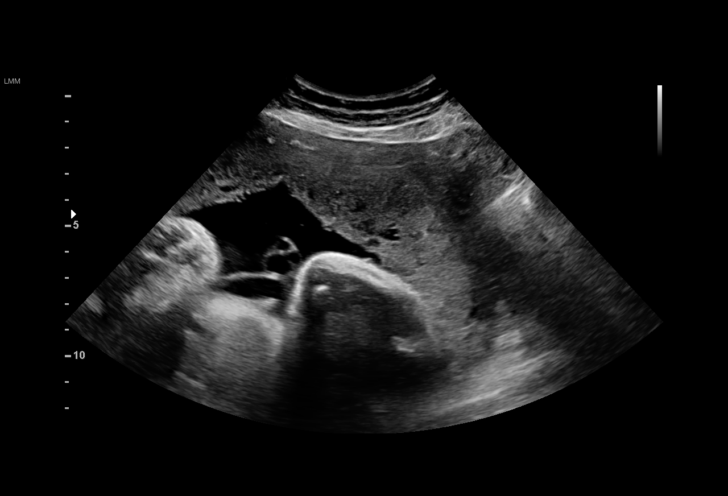
[im 12/23]
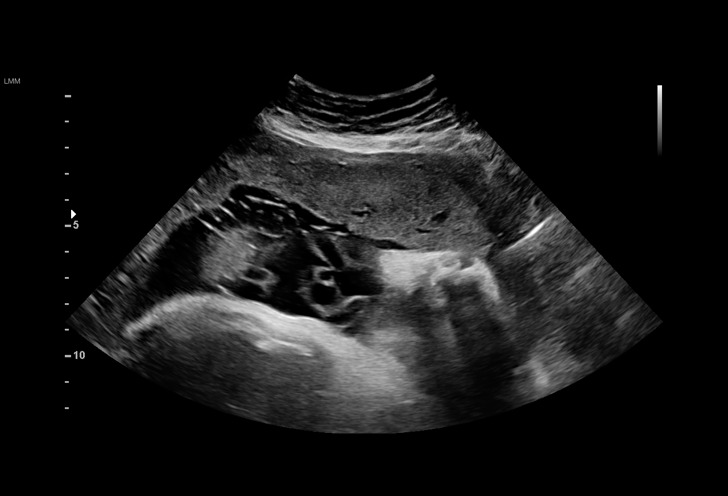
[im 14/23]
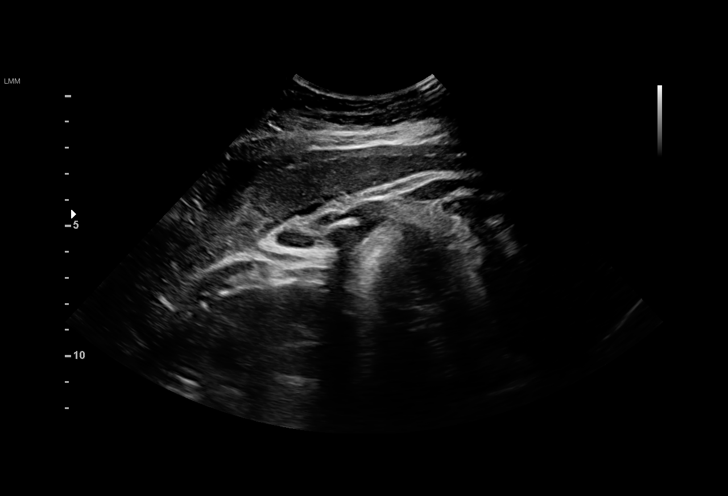
[im 15/23]
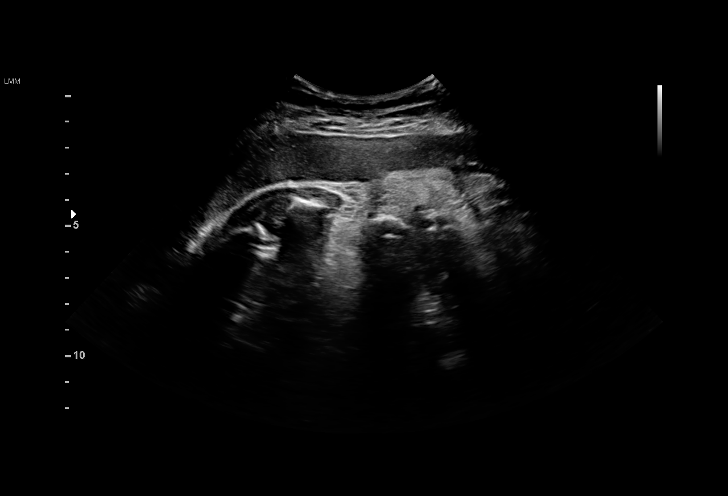
[im 17/23]
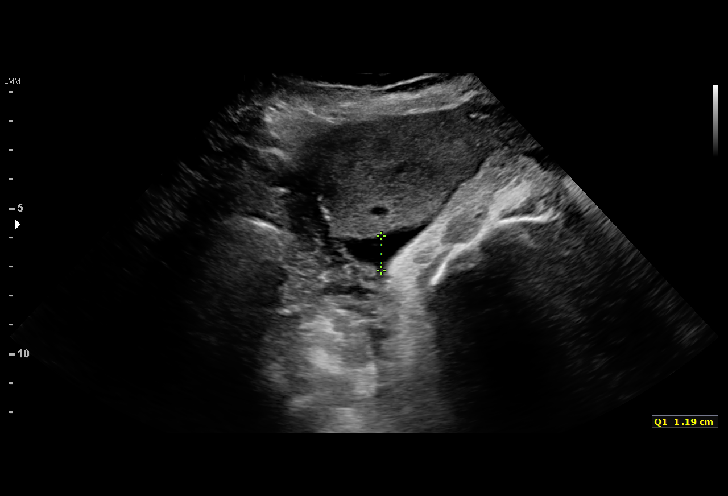
[im 18/23]
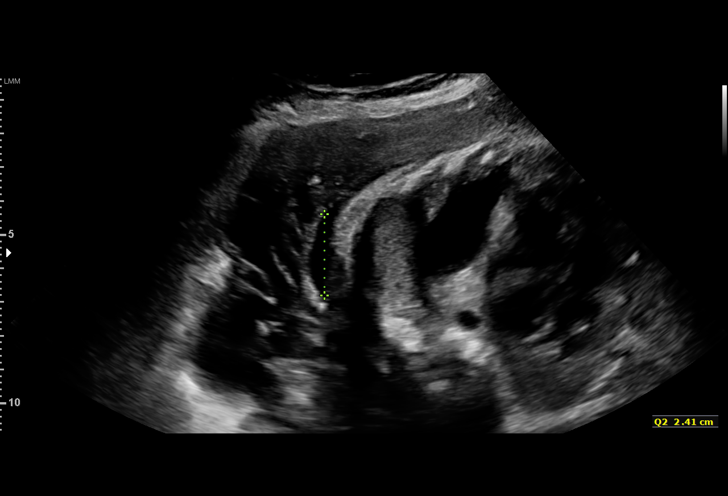
[im 20/23]
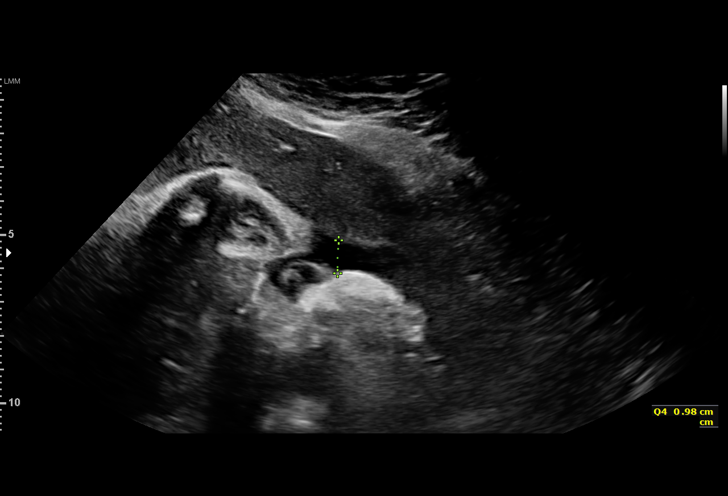
[im 21/23]
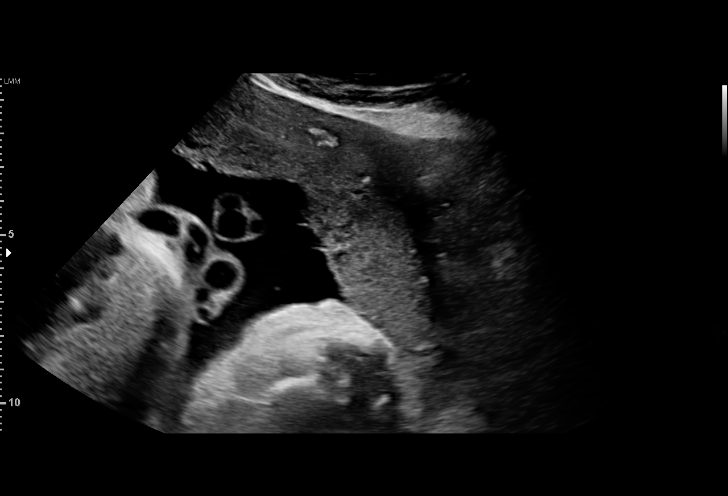
[im 23/23]
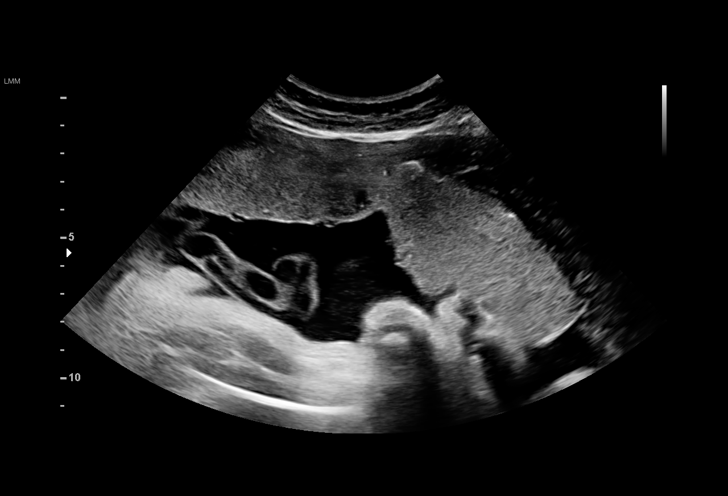

[15 of 23 positions shown; findings below may reference images not displayed]

Attending:        Panda Mrtz        Secondary Phy.:   NOMASIBULELE Nursing-
MAU/Triage
AMERA NP

1  JIDUO TABERNACLE           [id number]      [id number]     [id number]
Indications

36 weeks gestation of pregnancy
Vaginal bleeding in pregnancy, third trimester
OB History

Gravidity:    7         Term:   2        Prem:   1        SAB:   1
TOP:          2       Ectopic:  0        Living: 3
Fetal Evaluation

Num Of Fetuses:     1
Fetal Heart         153
Rate(bpm):
Cardiac Activity:   Observed
Presentation:       Cephalic
Placenta:           Anterior, lt. lateral, above cervical os

Amniotic Fluid
AFI FV:      Subjectively within normal limits

AFI Sum(cm)     %Tile       Largest Pocket(cm)
7.96            7

RUQ(cm)       RLQ(cm)       LUQ(cm)        LLQ(cm)
1.19

Comment:    No definite evidence of placental abruption.
Gestational Age

LMP:           36w 2d        Date:  06/17/15                 EDD:   03/23/16
Best:          36w 2d     Det. By:  LMP  (06/17/15)          EDD:   03/23/16
Impression

Single IUP at 36w 2d
Limited ultrasound performed due to vaginal bleeding
Cephalic presentation
Anterior / left lateral placenta without previa
No sonographic findings suggestive of abruption noted
Normal amniotic fluid volume
Recommendations

Follow up ultrasounds as clinically indicated

## 2016-11-22 IMAGING — US US MFM FETAL BPP W/O NON-STRESS
1 series · 13 of 28 positions shown · non-contrast
Comparison: none

[Series 1: us mfm fetal bpp w/o non-stress · 30 acquisitions, 13 frames shown]
[im 2/30]
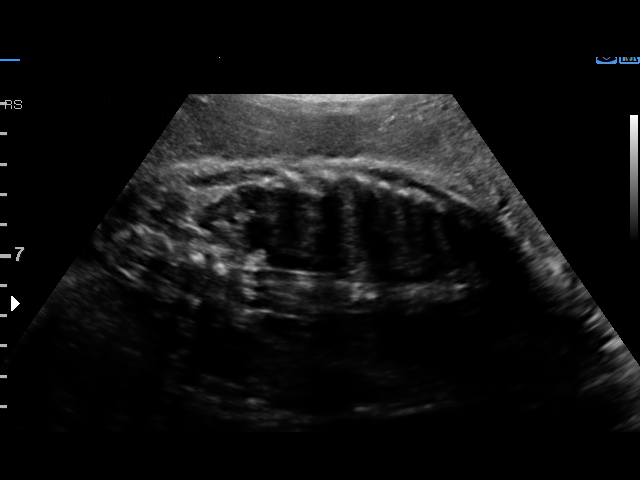
[im 4/30]
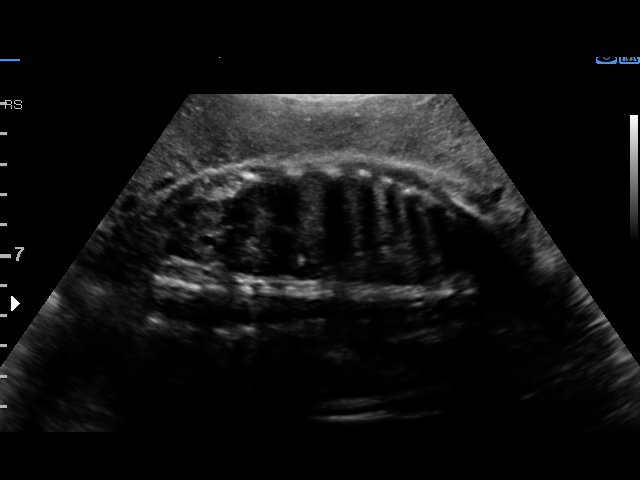
[im 6/30]
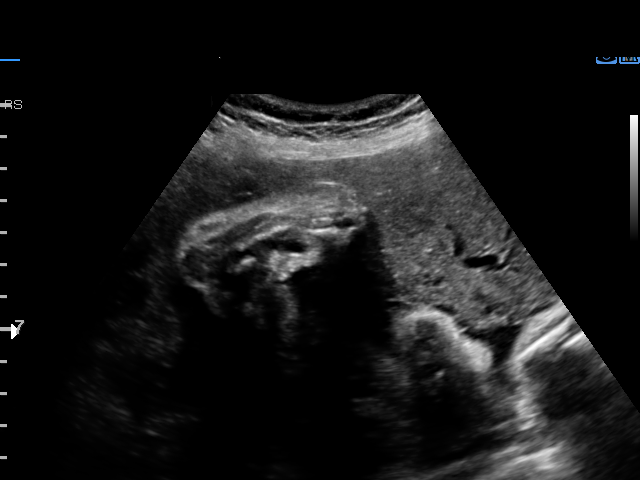
[im 8/30]
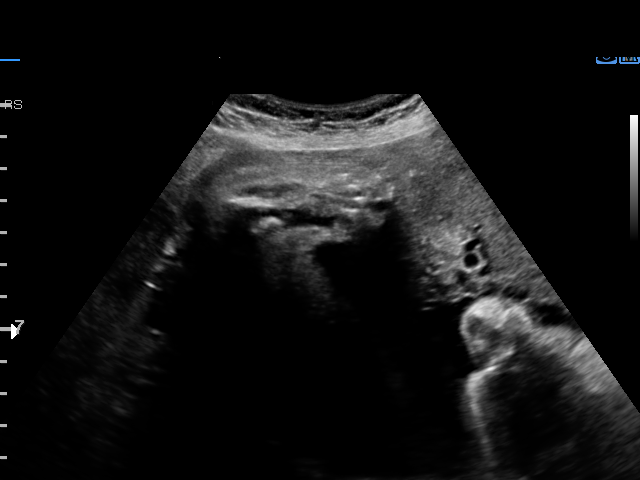
[im 10/30]
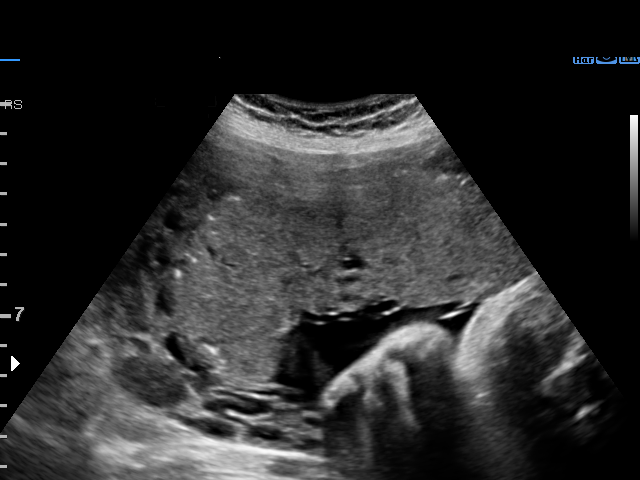
[im 12/30]
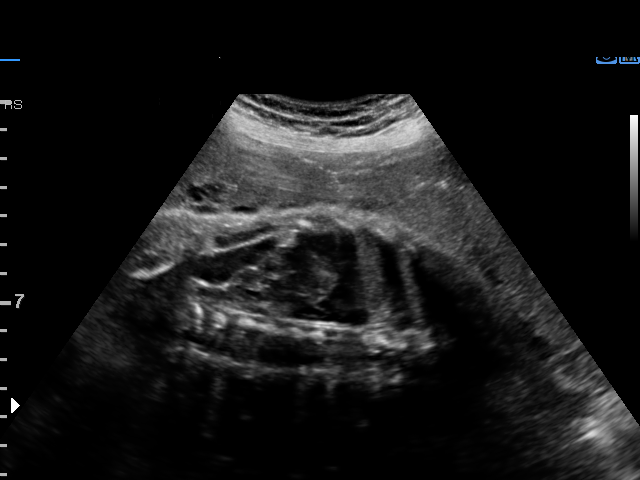
[im 16/30]
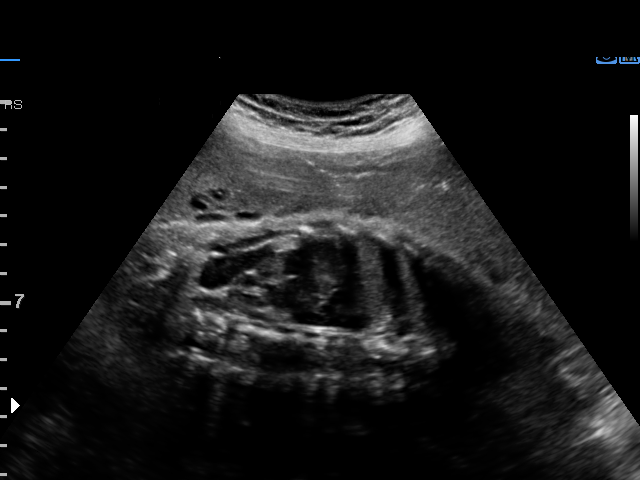
[im 18/30]
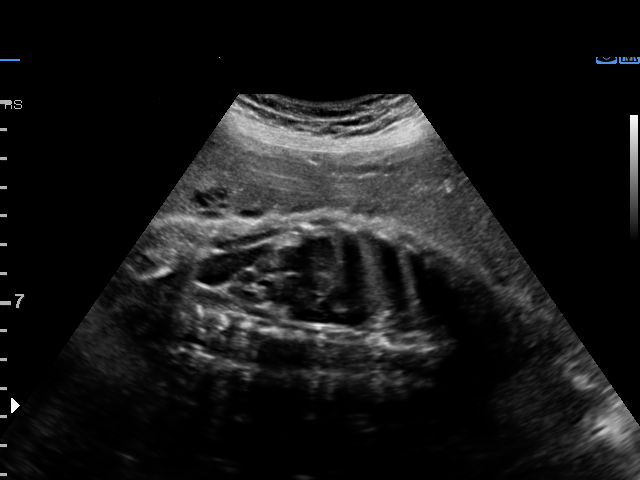
[im 20/30]
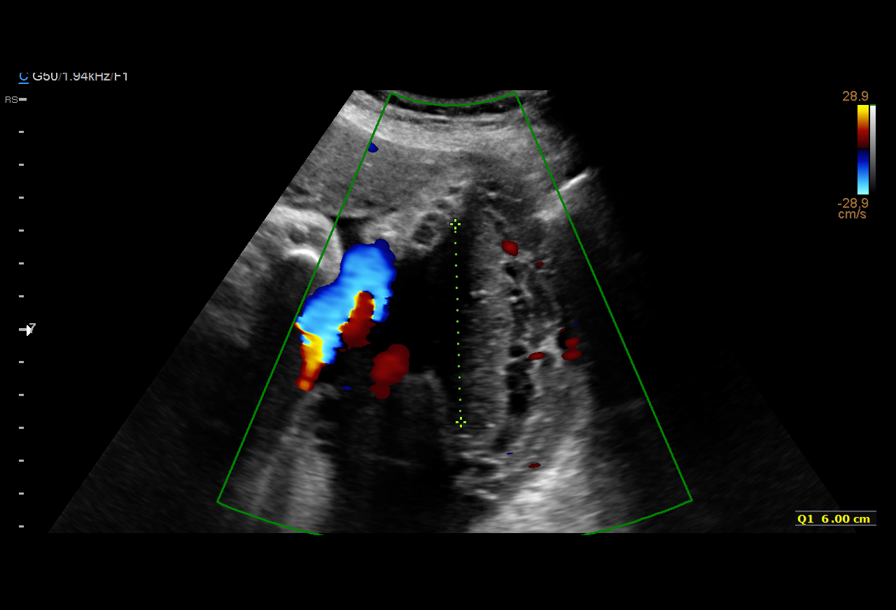
[im 22/30]
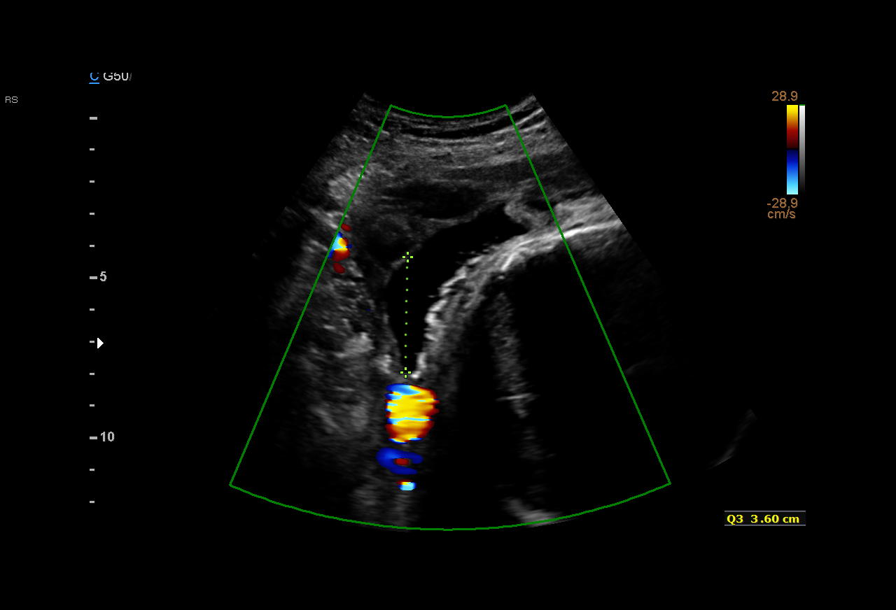
[im 24/30]
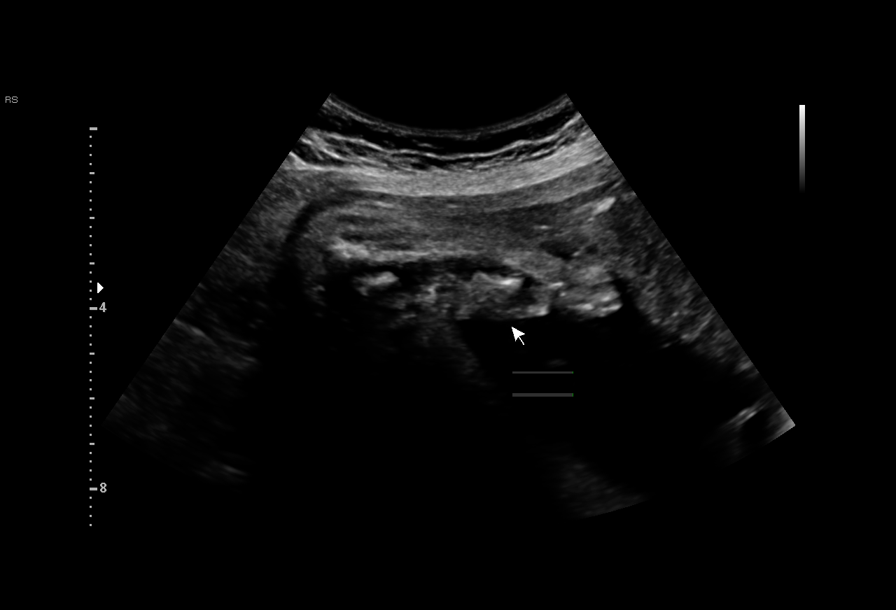
[im 26/30]
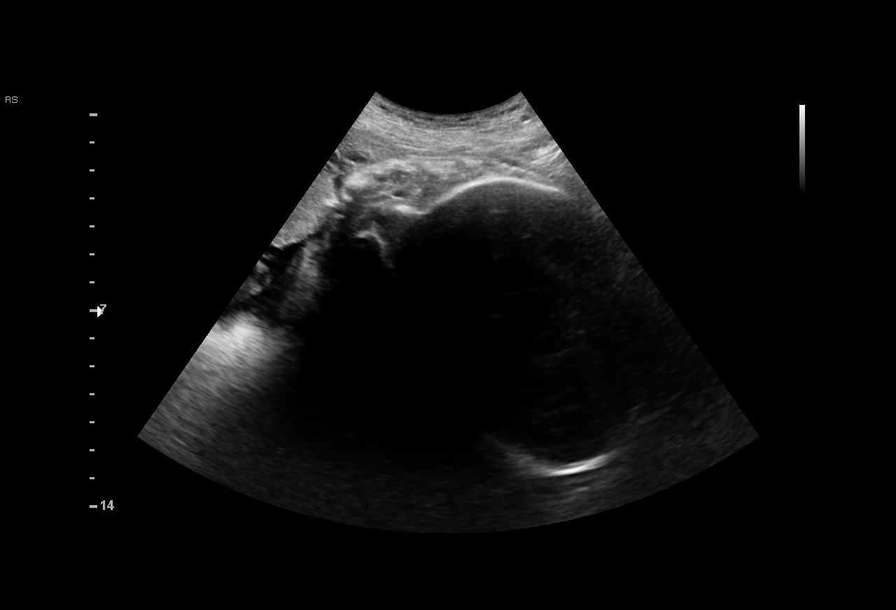
[im 28/30]
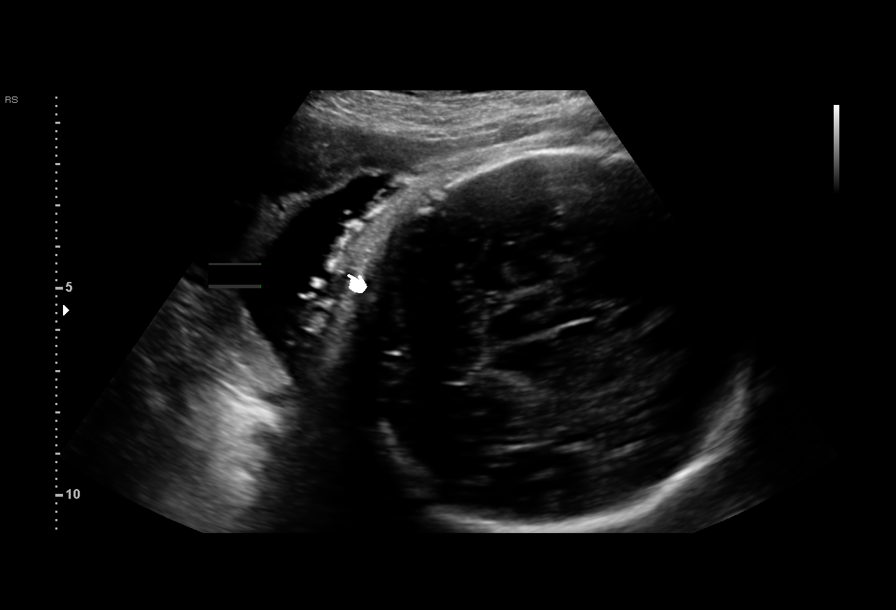

[13 of 28 positions shown; findings below may reference images not displayed]

Obstetrics &
Gynecology
[address]
[place] [address].
Attending:        Josmar Cheong        Secondary Phy.:   ANTE Nursing-
Antenatal [REDACTED]9

1  FERIENHAUS ERXLEBEN                [id number]      [id number]     [id number]
Indications

36 weeks gestation of pregnancy
Vaginal bleeding in pregnancy, third trimester
Advanced maternal age multigravida 40,
second trimester (low risk Panorama)
Abnormal biochemical screen (elevated
AFP; ONTD [DATE])
Previous child with congenital anomaly,
antepartum (Transposition of GV; MVP)
OB History

Gravidity:    7         Term:   2        Prem:   1        SAB:   1
TOP:          2       Ectopic:  0        Living: 3
Fetal Evaluation

Num Of Fetuses:     1
Fetal Heart         138
Rate(bpm):
Cardiac Activity:   Observed
Presentation:       Cephalic

Amniotic Fluid
AFI FV:      Subjectively within normal limits

AFI Sum(cm)     %Tile       Largest Pocket(cm)
17.16           64          6
RUQ(cm)       RLQ(cm)       LUQ(cm)        LLQ(cm)
4.01          3.6           6
Biophysical Evaluation

Amniotic F.V:   Pocket => 2 cm two         F. Tone:        Observed
planes
F. Movement:    Observed                   Score:          [DATE]
F. Breathing:   Observed
Gestational Age

LMP:           36w 4d       Date:   06/17/15                 EDD:   03/23/16
Best:          36w 4d    Det. By:   LMP  (06/17/15)          EDD:   03/23/16
Impression

Single IUP at 36w 4d
Third trimester vaginal bleeding
BPP [DATE]
Normal amniotic fluid volume
Recommendations

Follow-up ultrasounds as clinically indicated.

## 2016-12-12 ENCOUNTER — Telehealth (HOSPITAL_COMMUNITY): Payer: Self-pay | Admitting: Lactation Services

## 2016-12-12 NOTE — Telephone Encounter (Signed)
Mom called and reports she has returned to work and supply has decreased by half. Mom reports she has increased pumping to every 2 hours once noticing milk supply decreasing. She is breast feeding infant overnight and in the morning and evenings. She is pumping every 2 hours at work as able to. She was using a manual pump for a few days and now has a PIS that belonged to her sister. Biggest change recently is that mom returned to work full time. Mom reports she is looking at a picture of infant when pumping, she is staring at pump.   Enc mom to continue pumping using hands on pumping, not watching pump, relax with pumping, and continue to BF infant when she is home with her as much as she wants.  Mom reports she recently began giving the child water in a sippy cup and infant is drinking a lot of water when offered. Discussed that BM is mostly water and that infant may not need water when mom is home, enc her to offer breast instead. Discussed milk supply naturally decreases as infant eating more solids. Enc mom to get car adapter and hands free bra to pump on the way to work in the morning as she does not have time to pump in the morning, she drives > 1 hour to work each day. Reviewed power pumping at night and discussed that herbs may be helpful, advised her to talk with OB about herbs. Mom does not wish to give infant formula and reports infant seems satisfied after BF when mom is home. Infant is eating table food and is eating well per mom. Mom to call back with further questions/concerns.

## 2017-08-27 ENCOUNTER — Emergency Department (HOSPITAL_COMMUNITY): Payer: Medicaid Other

## 2017-08-27 ENCOUNTER — Emergency Department (HOSPITAL_COMMUNITY)
Admission: EM | Admit: 2017-08-27 | Discharge: 2017-08-27 | Disposition: A | Discharge status: Home / Self Care | Payer: Medicaid Other | Attending: Emergency Medicine | Admitting: Emergency Medicine

## 2017-08-27 DIAGNOSIS — Z79899 Other long term (current) drug therapy: Secondary | ICD-10-CM | POA: Diagnosis not present

## 2017-08-27 DIAGNOSIS — I2699 Other pulmonary embolism without acute cor pulmonale: Secondary | ICD-10-CM | POA: Insufficient documentation

## 2017-08-27 DIAGNOSIS — R600 Localized edema: Secondary | ICD-10-CM | POA: Diagnosis not present

## 2017-08-27 DIAGNOSIS — J45909 Unspecified asthma, uncomplicated: Secondary | ICD-10-CM | POA: Diagnosis not present

## 2017-08-27 DIAGNOSIS — R0602 Shortness of breath: Secondary | ICD-10-CM | POA: Diagnosis present

## 2017-08-27 LAB — BASIC METABOLIC PANEL
ANION GAP: 9 (ref 5–15)
BUN: 5 mg/dL — ABNORMAL LOW (ref 6–20)
CO2: 21 mmol/L — ABNORMAL LOW (ref 22–32)
Calcium: 9.2 mg/dL (ref 8.9–10.3)
Chloride: 108 mmol/L (ref 101–111)
Creatinine, Ser: 0.71 mg/dL (ref 0.44–1.00)
GFR calc Af Amer: >60 mL/min (ref >60)
Glucose, Bld: 92 mg/dL (ref 65–99)
Potassium: 3.7 mmol/L (ref 3.5–5.1)
SODIUM: 138 mmol/L (ref 135–145)

## 2017-08-27 LAB — CBC
HEMATOCRIT: 41 % (ref 36.0–46.0)
HEMOGLOBIN: 13.8 g/dL (ref 12.0–15.0)
MCH: 31 pg (ref 26.0–34.0)
MCHC: 33.7 g/dL (ref 30.0–36.0)
MCV: 92.1 fL (ref 78.0–100.0)
Platelets: 287 10*3/uL (ref 150–400)
RBC: 4.45 MIL/uL (ref 3.87–5.11)
RDW: 12.6 % (ref 11.5–15.5)
WBC: 8.8 10*3/uL (ref 4.0–10.5)

## 2017-08-27 LAB — I-STAT BETA HCG BLOOD, ED (MC, WL, AP ONLY)

## 2017-08-27 LAB — I-STAT TROPONIN, ED: Troponin i, poc: 0 ng/mL (ref 0.00–0.08)

## 2017-08-27 MED ORDER — PREDNISONE 20 MG PO TABS: 40.0000 mg | ORAL_TABLET | Freq: Every day | ORAL | 0 refills | Status: AC

## 2017-08-27 MED ORDER — RIVAROXABAN 15 MG PO TABS
15.00 | ORAL_TABLET | ORAL | Status: DC
Start: 2017-08-25 — End: 2017-08-27

## 2017-08-27 MED ORDER — GUAIFENESIN-DM 100-10 MG/5ML PO SYRP: 5.0000 mL | ORAL_SOLUTION | Freq: Three times a day (TID) | ORAL | 0 refills | Status: AC | PRN

## 2017-08-27 MED ORDER — ALBUTEROL SULFATE HFA 108 (90 BASE) MCG/ACT IN AERS
2.0000 | INHALATION_SPRAY | RESPIRATORY_TRACT | Status: DC | PRN
  Administered 2017-08-27: 2 via RESPIRATORY_TRACT
  Filled 2017-08-27: qty 6.7

## 2017-08-27 NOTE — ED Triage Notes (Signed)
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 Today she called her PCP for follow up and told her to come back to the ER.

## 2017-08-27 NOTE — ED Provider Notes (Signed)
[facility] EMERGENCY DEPARTMENT Provider Note   CSN: [id number] Arrival date & time: 08/27/17  1039     History   Chief Complaint Chief Complaint  Patient presents with  . Shortness of Breath    HPI Joanne Baker is a 42 y.o. female.  HPI Patient presents with shortness of breath.  Has bilateral PEs that were diagnosed 2 days ago at [facility].  She is on Xarelto.  Was discharged home from there and was told to follow-up the primary care doctor.  Called her primary care doctor and was told she had to come to the ER because she had to be admitted to the hospital.  She has had shortness of breath.  States she has had a cough.  States that shortness of breath has been rather constant since the PEs were diagnosed.  No fevers.  No hemoptysis.  History of asthma states she feels as if her asthma is flaring up.  However the inhaler did not help with the breathing.  Slight chest tightness when she lays back.  Has some swelling and pain in the right lower leg.  States they started after long car rides.  Has not had blood clots previously.  No bleeding. Past Medical History:  Diagnosis Date  . Anemia   . Anxiety 2014  . Asthma   . Depression 2014  . Genital warts   . Postpartum hemorrhage    after 2nd delivery    Patient Active Problem List   Diagnosis Date Noted  . Vaginal delivery 03/06/2016  . Retained placenta 03/06/2016  . Advanced maternal age in multigravida 03/05/2016  . Family history of congenital heart disease 03/05/2016  . Asthma 03/05/2016  . Abnormal chromosomal and genetic finding on antenatal screening mother 03/05/2016  . Genital warts 03/05/2016  . Vitamin D deficiency 03/05/2016  . Depressive disorder 03/05/2016  . Third trimester bleeding, antepartum 02/26/2016  . Left knee pain 02/11/2014  . Finger pain, right 02/11/2014  . 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 CESAREAN SECTION    .  DILATION AND CURETTAGE OF UTERUS N/A 03/14/2016   Procedure: DILATATION AND CURETTAGE;  Surgeon: Silverio Lay, MD;  Location: [facility];  Service: Gynecology;  Laterality: N/A;  . DILATION AND EVACUATION N/A 03/06/2016   Procedure: MANUAL REMOVAL OF PLACENTA;  Surgeon: Hoover Browns, MD;  Location: [facility];  Service: Gynecology;  Laterality: N/A;  . KNEE SURGERY Left   . PERINEAL LACERATION REPAIR N/A 03/06/2016   Procedure: SUTURE REPAIR CERVICAL LACERATION;  Surgeon: Hoover Browns, MD;  Location: [facility];  Service: Gynecology;  Laterality: N/A;     OB History    Gravida  7   Para  4   Term  3   Preterm  1   AB  3   Living  4     SAB  1   TAB  2   Ectopic      Multiple      Live Births  4            Home Medications    Prior to Admission medications   Medication Sig Start Date End Date Taking? Authorizing Provider  albuterol (PROVENTIL HFA;VENTOLIN HFA) 108 (90 BASE) MCG/ACT inhaler Inhale 1-2 puffs into the lungs every 6 (six) hours as needed for wheezing or shortness of breath.     [provider]  amoxicillin-clavulanate (AUGMENTIN) 875-125 MG tablet Take 1 tablet  by mouth 2 (two) times daily.    [provider]  Cholecalciferol (VITAMIN D) 2000 units CAPS Take 4,000 Units by mouth daily.    [provider]  ferrous sulfate 325 (65 FE) MG tablet Take 1 tablet (325 mg total) by mouth 2 (two) times daily with a meal. 03/08/16   Sherre Scarlet, CNM  guaiFENesin-dextromethorphan (ROBITUSSIN DM) 100-10 MG/5ML syrup Take 5 mLs by mouth 3 (three) times daily as needed for cough. 08/27/17   Benjiman Core, MD  ibuprofen (ADVIL,MOTRIN) 600 MG tablet Take 1 tablet (600 mg total) by mouth every 6 (six) hours as needed for fever, headache, mild pain, moderate pain or cramping. 03/08/16   Sherre Scarlet, CNM  montelukast (SINGULAIR) 10 MG tablet Take 10 mg by mouth daily.    [provider]  norethindrone  (MICRONOR,CAMILA,ERRIN) 0.35 MG tablet Take 1 tablet (0.35 mg total) by mouth daily. 03/08/16   Sherre Scarlet, CNM  predniSONE (DELTASONE) 20 MG tablet Take 2 tablets (40 mg total) by mouth daily. 08/27/17   Benjiman Core, MD  Prenatal Vit-Fe Fumarate-FA (PRENATAL MULTIVITAMIN) TABS tablet Take 1 tablet by mouth daily.    [provider]    Family History Family History  Problem Relation Age of Onset  . Hypothyroidism Mother   . Arthritis Mother   . Cancer Father   . Diabetes Father   . Heart disease Father   . Cancer Sister   . Hypothyroidism Sister     Social History Social History   Tobacco Use  . Smoking status: Never Smoker  . Smokeless tobacco: Never Used  Substance Use Topics  . Alcohol use: No    Alcohol/week: 3.0 oz    Types: 5 Standard drinks or equivalent per week  . Drug use: No     Allergies   Patient has no known allergies.   Review of Systems Review of Systems  Constitutional: Negative for appetite change.  HENT: Negative for congestion.   Respiratory: Positive for cough and shortness of breath.   Cardiovascular: Positive for chest pain and leg swelling.  Gastrointestinal: Negative for abdominal pain.  Genitourinary: Negative for flank pain.  Musculoskeletal: Negative for back pain.  Skin: Negative for rash.  Neurological: Negative for seizures.  Hematological: Negative for adenopathy.  Psychiatric/Behavioral: Negative for confusion.     Physical Exam Updated Vital Signs BP 117/86   Pulse 77   Temp 98.3 F (36.8 C) (Oral)   Resp 19   SpO2 100%   Physical Exam  Constitutional: She appears well-developed and well-nourished.  HENT:  Head: Atraumatic.  Eyes: Pupils are equal, round, and reactive to light.  Neck: Neck supple.  Cardiovascular: Regular rhythm.  Pulmonary/Chest: Effort normal.  No wheezes.  Mild dyspnea.  Abdominal: Soft. There is no tenderness.  Musculoskeletal:       Right lower leg: She exhibits  tenderness.       Left lower leg: She exhibits no tenderness.  Tenderness to right calf.  Mild edema.  Neurological: She is alert.  Skin: Capillary refill takes less than 2 seconds.     ED Treatments / Results  Labs (all labs ordered are listed, but only abnormal results are displayed) Labs Reviewed  BASIC METABOLIC PANEL - Abnormal; Notable for the following components:      Result Value   CO2 21 (*)    BUN 5 (*)    All other components within normal limits  CBC  I-STAT TROPONIN, ED  I-STAT BETA HCG BLOOD, ED ([facility],  [facility], [facility] ONLY)    EKG EKG Interpretation  Date/Time:  Monday August 27 2017 10:46:30 EDT Ventricular Rate:  95 PR Interval:  134 QRS Duration: 66 QT Interval:  336 QTC Calculation: 422 R Axis:   73 Text Interpretation:  Normal sinus rhythm Normal ECG Confirmed by Benjiman CorePickering, Mychelle Kendra [phone]([id number]) on 08/27/2017 4:10:51 PM   Radiology Dg Chest 2 View  Result Date: 08/27/2017 CLINICAL DATA:  Shortness of breath since Saturday. EXAM: CHEST - 2 VIEW COMPARISON:  None. FINDINGS: The heart size and mediastinal contours are within normal limits. Both lungs are clear. The visualized skeletal structures are unremarkable. IMPRESSION: Normal chest x-ray. Electronically Signed   By: Rudie MeyerP.  Gallerani M.D.   On: 08/27/2017 12:07    Procedures Procedures (including critical care time)  Medications Ordered in ED Medications  albuterol (PROVENTIL HFA;VENTOLIN HFA) 108 (90 Base) MCG/ACT inhaler 2 puff (has no administration in time range)     Initial Impression / Assessment and Plan / ED Course  I have reviewed the triage vital signs and the nursing notes.  Pertinent labs & imaging results that were available during my care of the patient were reviewed by me and considered in my medical decision making (see chart for details).    Patient with pulmonary embolism.  Reviewed note from outside hospital.  Also discussed with Dr.Ismail from pulmonology.  States the only criteria for lytics  is hypotension.  EKG reassuring.  Not hypoxic with ambulation or on room air.  Not hypotensive.  We will continue Xarelto.  Will add steroids since she feels as if her asthma is flaring up.  Discharge home follow-up with pulmonology as an outpatient.  Does not appear to need acute admission at this time.  Final Clinical Impressions(s) / ED Diagnoses   Final diagnoses:  Other acute pulmonary embolism without acute cor pulmonale (HCC)  Asthma, unspecified asthma severity, unspecified whether complicated, unspecified whether persistent    ED Discharge Orders        Ordered    predniSONE (DELTASONE) 20 MG tablet  Daily     08/27/17 1750    guaiFENesin-dextromethorphan (ROBITUSSIN DM) 100-10 MG/5ML syrup  3 times daily PRN     08/27/17 1750       Benjiman CorePickering, Moana Munford, MD 08/27/17 1808

## 2018-05-22 IMAGING — DX DG CHEST 2V
2 series · 2 of 2 positions shown · non-contrast
Comparison: None.

CLINICAL DATA: Shortness of breath since [REDACTED].

EXAM:
CHEST - 2 VIEW

[w chest pa]
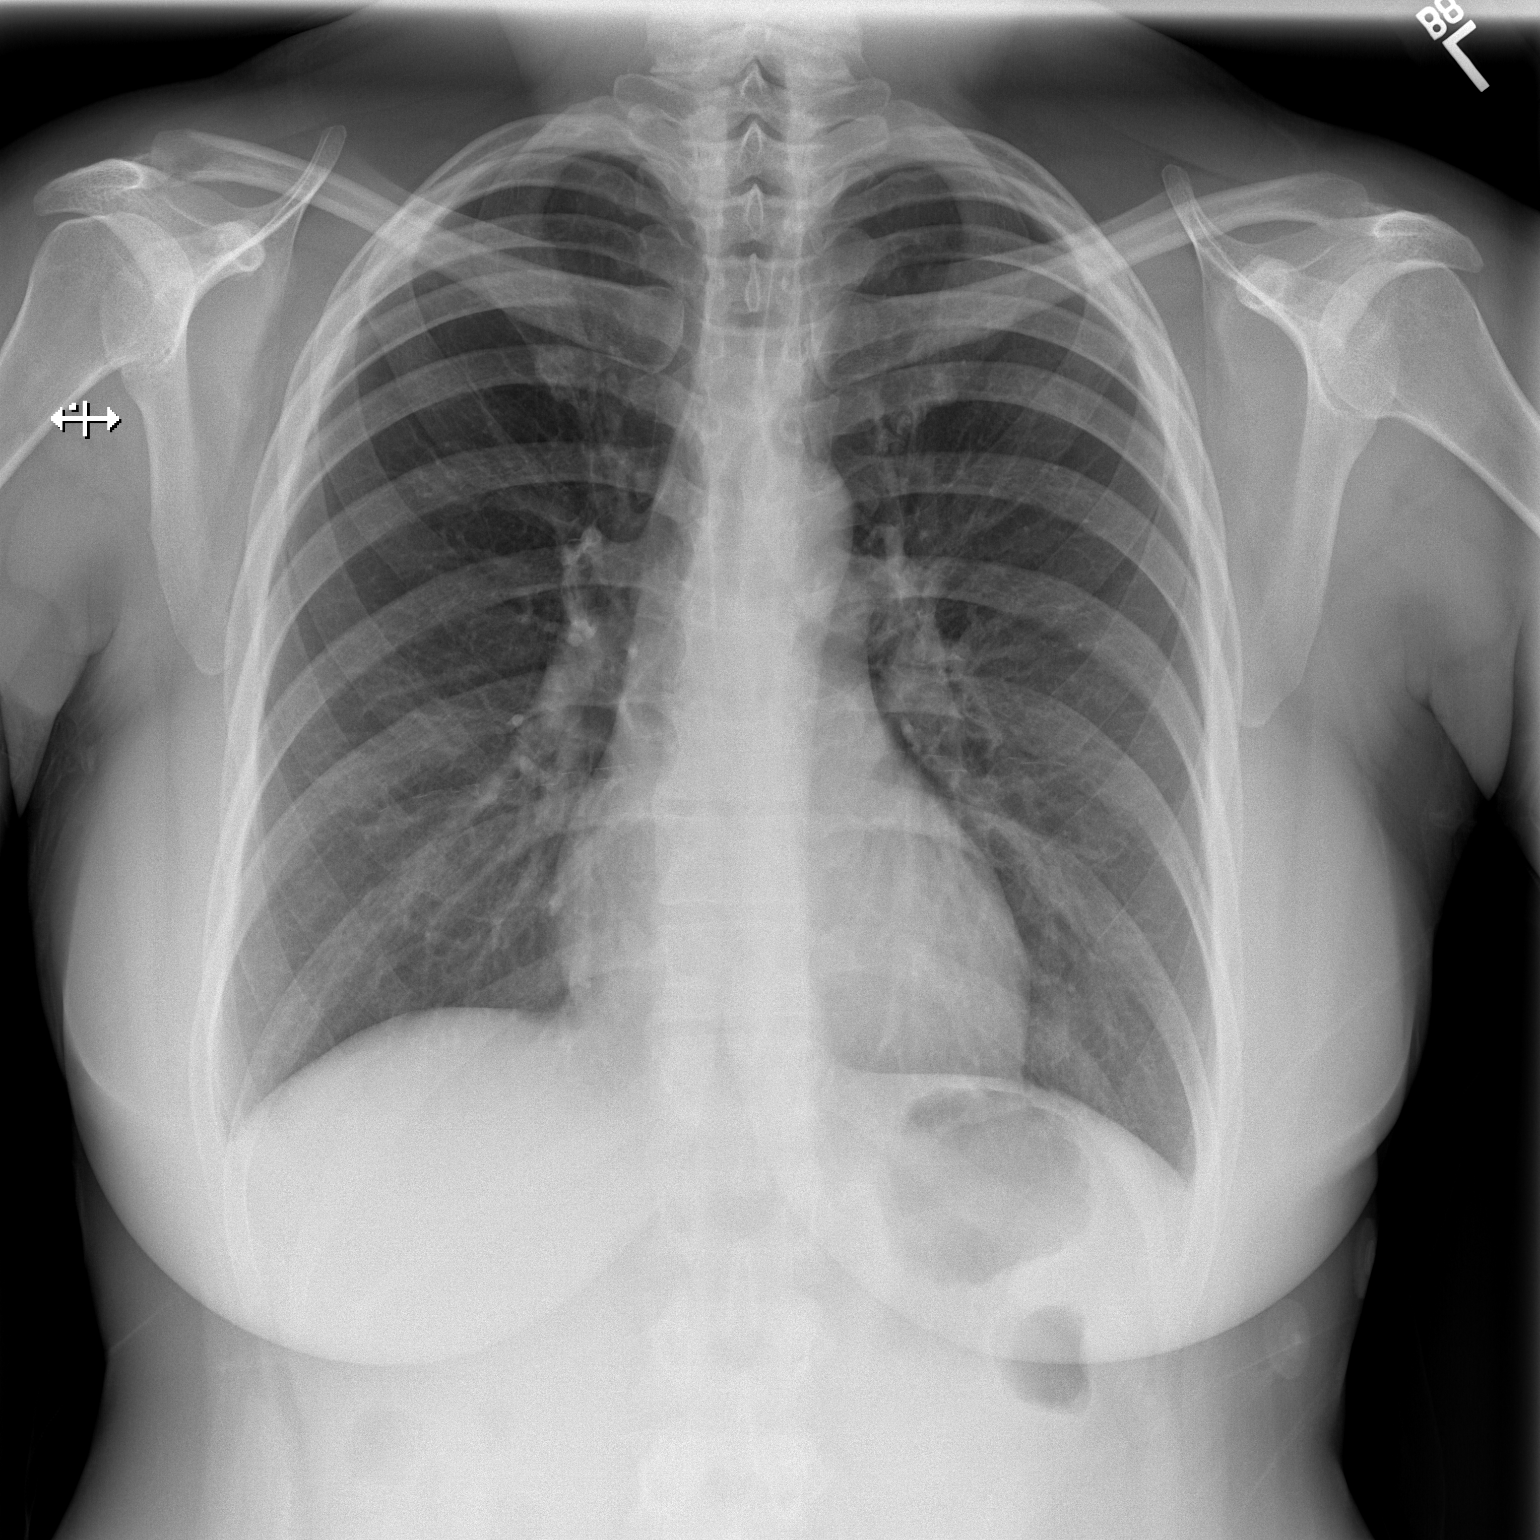

[w chest lat]
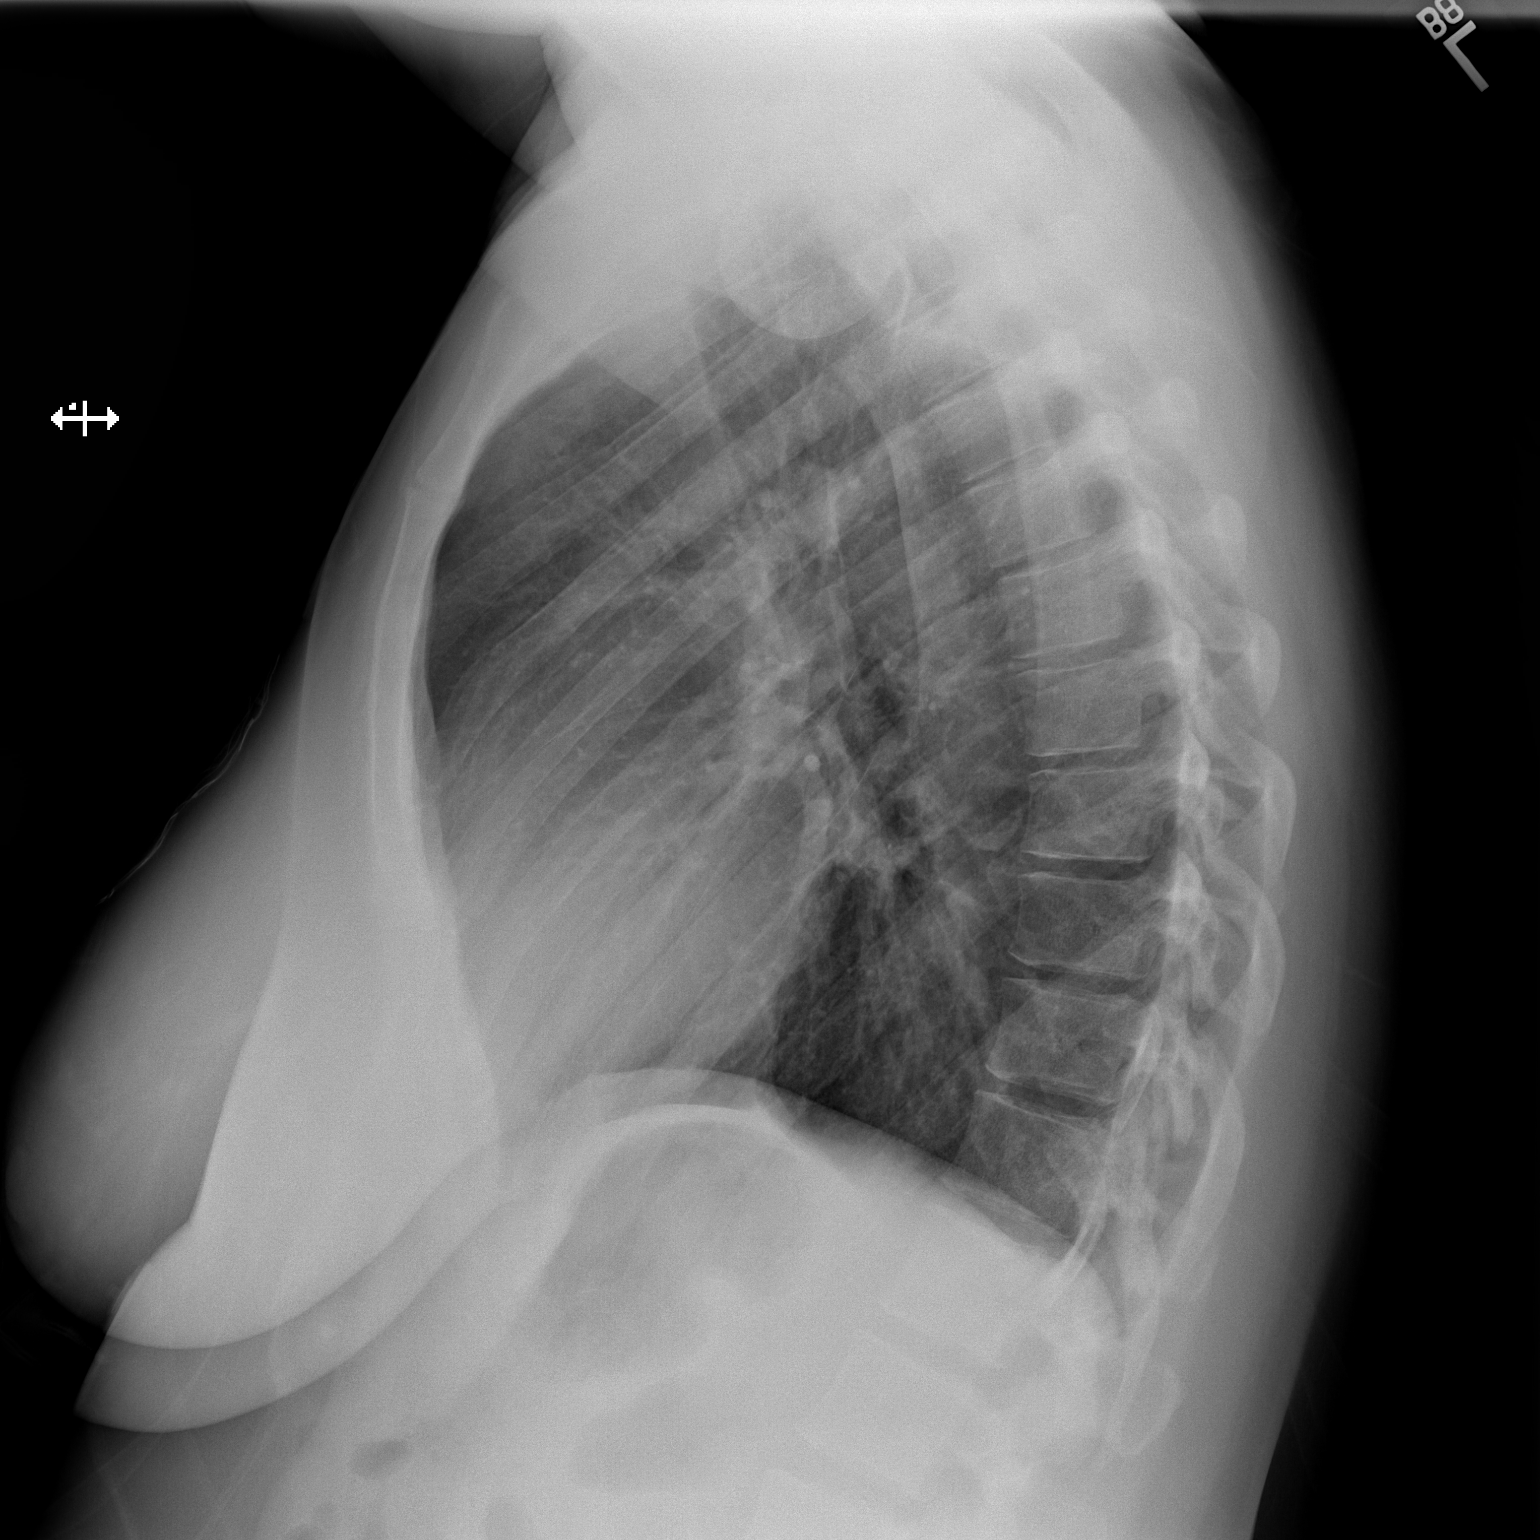

[2 of 2 positions shown; findings below may reference images not displayed]

FINDINGS: The heart size and mediastinal contours are within normal limits.
Both lungs are clear. The visualized skeletal structures are
unremarkable.
IMPRESSION: Normal chest x-ray.
